# Patient Record
Sex: Female | Born: 1990 | Race: White | Hispanic: No | Marital: Married | Smoking: Never smoker
Health system: Southern US, Community
[De-identification: ages and names within clinical notes are randomized; demographics above are authoritative.]

## PROBLEM LIST (undated history)

## (undated) DIAGNOSIS — K5090 Crohn's disease, unspecified, without complications: Secondary | ICD-10-CM

## (undated) DIAGNOSIS — K5190 Ulcerative colitis, unspecified, without complications: Secondary | ICD-10-CM

## (undated) DIAGNOSIS — G43909 Migraine, unspecified, not intractable, without status migrainosus: Secondary | ICD-10-CM

## (undated) DIAGNOSIS — T7840XA Allergy, unspecified, initial encounter: Secondary | ICD-10-CM

## (undated) DIAGNOSIS — K91850 Pouchitis: Secondary | ICD-10-CM

## (undated) HISTORY — PX: COLON SURGERY: SHX602

## (undated) HISTORY — DX: Pouchitis: K91.850

## (undated) HISTORY — DX: Allergy, unspecified, initial encounter: T78.40XA

## (undated) HISTORY — DX: Crohn's disease, unspecified, without complications: K50.90

---

## 2010-07-29 HISTORY — PX: TOTAL COLECTOMY: SHX852

## 2010-07-29 HISTORY — PX: COLOPROCTECTOMY W/ ILEO J POUCH: SUR277

## 2010-07-29 HISTORY — PX: ILEAL POUCH: SHX5856

## 2017-01-28 DIAGNOSIS — Z719 Counseling, unspecified: Secondary | ICD-10-CM | POA: Insufficient documentation

## 2017-01-28 DIAGNOSIS — M810 Age-related osteoporosis without current pathological fracture: Secondary | ICD-10-CM | POA: Insufficient documentation

## 2017-01-28 DIAGNOSIS — K51919 Ulcerative colitis, unspecified with unspecified complications: Secondary | ICD-10-CM | POA: Insufficient documentation

## 2017-01-28 DIAGNOSIS — Z0000 Encounter for general adult medical examination without abnormal findings: Secondary | ICD-10-CM | POA: Insufficient documentation

## 2017-09-12 DIAGNOSIS — G43909 Migraine, unspecified, not intractable, without status migrainosus: Secondary | ICD-10-CM | POA: Insufficient documentation

## 2018-06-19 DIAGNOSIS — E876 Hypokalemia: Secondary | ICD-10-CM | POA: Insufficient documentation

## 2018-12-29 DIAGNOSIS — Z98891 History of uterine scar from previous surgery: Secondary | ICD-10-CM | POA: Insufficient documentation

## 2021-01-03 DIAGNOSIS — Z3480 Encounter for supervision of other normal pregnancy, unspecified trimester: Secondary | ICD-10-CM | POA: Insufficient documentation

## 2021-01-16 DIAGNOSIS — O1211 Gestational proteinuria, first trimester: Secondary | ICD-10-CM | POA: Insufficient documentation

## 2021-03-22 DIAGNOSIS — O99019 Anemia complicating pregnancy, unspecified trimester: Secondary | ICD-10-CM | POA: Insufficient documentation

## 2021-04-17 ENCOUNTER — Other Ambulatory Visit: Payer: 59

## 2021-04-17 ENCOUNTER — Other Ambulatory Visit: Payer: Self-pay

## 2021-04-17 ENCOUNTER — Ambulatory Visit (INDEPENDENT_AMBULATORY_CARE_PROVIDER_SITE_OTHER): Payer: 59 | Admitting: Student

## 2021-04-17 ENCOUNTER — Encounter: Payer: Self-pay | Admitting: Student

## 2021-04-17 VITALS — BP 127/77 | HR 92 | Ht 67.0 in | Wt 149.0 lb

## 2021-04-17 DIAGNOSIS — Z23 Encounter for immunization: Secondary | ICD-10-CM | POA: Diagnosis not present

## 2021-04-17 DIAGNOSIS — Z3490 Encounter for supervision of normal pregnancy, unspecified, unspecified trimester: Secondary | ICD-10-CM

## 2021-04-17 DIAGNOSIS — O0990 Supervision of high risk pregnancy, unspecified, unspecified trimester: Secondary | ICD-10-CM

## 2021-04-17 DIAGNOSIS — Z3A26 26 weeks gestation of pregnancy: Secondary | ICD-10-CM | POA: Diagnosis not present

## 2021-04-17 DIAGNOSIS — K5180 Other ulcerative colitis without complications: Secondary | ICD-10-CM | POA: Diagnosis not present

## 2021-04-17 DIAGNOSIS — K5190 Ulcerative colitis, unspecified, without complications: Secondary | ICD-10-CM | POA: Insufficient documentation

## 2021-04-17 DIAGNOSIS — Z98891 History of uterine scar from previous surgery: Secondary | ICD-10-CM | POA: Insufficient documentation

## 2021-04-17 NOTE — Progress Notes (Signed)
  Subjective:    Brooke Mckee is being seen today for her first obstetrical visit.  This is a planned pregnancy. She is at [redacted]w[redacted]d gestation. Her obstetrical history is significant for  one c/section in December 29, 2018 . She has a history of colon resection with ileal analanastomosis pouch.  Relationship with FOB: spouse, living together. Patient does intend to breast feed. Pregnancy history fully reviewed.  Patient reports no complaints.  Review of Systems:   Review of Systems  Constitutional: Negative.   HENT: Negative.    Respiratory: Negative.    Cardiovascular: Negative.   Gastrointestinal: Negative.   Genitourinary: Negative.   Neurological: Negative.   Hematological: Negative.    Objective:     BP 127/77   Pulse 92   Ht 5\' 7"  (1.702 m)   Wt 149 lb (67.6 kg)   BMI 23.34 kg/m  Physical Exam Constitutional:      Appearance: Normal appearance.  Cardiovascular:     Pulses: Normal pulses.  Pulmonary:     Effort: Pulmonary effort is normal.  Musculoskeletal:     Cervical back: Normal range of motion.  Skin:    General: Skin is warm.  Neurological:     Mental Status: She is alert.    Exam    Assessment:    Pregnancy: G2P1000 Patient Active Problem List   Diagnosis Date Noted   Supervision of high risk pregnancy, antepartum 54/49/2010   Anemia complicating pregnancy, unspecified trimester 03/22/2021       Plan:     Initial labs drawn. Prenatal vitamins. Problem list reviewed and updated. AFP3 discussed:  normal, plus normal NIPS . Role of ultrasound in pregnancy discussed; fetal survey: ordered. Amniocentesis discussed: not indicated. Follow up in 4 weeks. 75 % of 30 min visit spent on counseling and coordination of care.  -pap normal at [facility] per patient -request filled out to get OP note from patient's practice in [place]; op note is not visible in care everywhere -GTT today Merv[facility] Ske[facility] 04/17/2021

## 2021-04-18 LAB — GLUCOSE TOLERANCE, 2 HOURS W/ 1HR
Glucose, 1 hour: 78 mg/dL (ref 65–179)
Glucose, 2 hour: 86 mg/dL (ref 65–152)
Glucose, Fasting: 72 mg/dL (ref 65–91)

## 2021-05-03 ENCOUNTER — Other Ambulatory Visit: Payer: Self-pay

## 2021-05-03 ENCOUNTER — Ambulatory Visit (INDEPENDENT_AMBULATORY_CARE_PROVIDER_SITE_OTHER): Payer: 59 | Admitting: Obstetrics & Gynecology

## 2021-05-03 VITALS — BP 125/66 | HR 89 | Wt 155.1 lb

## 2021-05-03 DIAGNOSIS — Z23 Encounter for immunization: Secondary | ICD-10-CM | POA: Diagnosis not present

## 2021-05-03 DIAGNOSIS — O0990 Supervision of high risk pregnancy, unspecified, unspecified trimester: Secondary | ICD-10-CM | POA: Diagnosis not present

## 2021-05-03 DIAGNOSIS — K5180 Other ulcerative colitis without complications: Secondary | ICD-10-CM

## 2021-05-03 DIAGNOSIS — Z98891 History of uterine scar from previous surgery: Secondary | ICD-10-CM

## 2021-05-03 DIAGNOSIS — Z3A28 28 weeks gestation of pregnancy: Secondary | ICD-10-CM

## 2021-05-03 NOTE — Progress Notes (Signed)
   PRENATAL VISIT NOTE  Subjective:  Brooke Mckee is a 30 y.o. G2P1001 at [redacted]w[redacted]d being seen today for ongoing prenatal care.  She is currently monitored for the following issues for this high-risk pregnancy and has Anemia complicating pregnancy, unspecified trimester; Supervision of high risk pregnancy, antepartum; History of C-section; Ulcerative colitis ([place]); and S/P primary low transverse C-section on their problem list.  Patient reports  occasional abdominal cramps with stress or activity .  Contractions: Not present. Vag. Bleeding: None.  Movement: Present. Denies leaking of fluid.   The following portions of the patient's history were reviewed and updated as appropriate: allergies, current medications, past family history, past medical history, past social history, past surgical history and problem list.   Objective:   Vitals:   05/03/21 1420  BP: 125/66  Pulse: 89  Weight: 155 lb 1.6 oz (70.4 kg)  FH 30  Fetal Status:     Movement: Present     General:  Alert, oriented and cooperative. Patient is in no acute distress.  Skin: Skin is warm and dry. No rash noted.   Cardiovascular: Normal heart rate noted  Respiratory: Normal respiratory effort, no problems with respiration noted  Abdomen: Soft, gravid, appropriate for gestational age.  Pain/Pressure: Present     Pelvic: Cervical exam deferred        Extremities: Normal range of motion.  Edema: None  Mental Status: Normal mood and affect. Normal behavior. Normal judgment and thought content.   Assessment and Plan:  Pregnancy: G2P1001 at [redacted]w[redacted]d 1. Supervision of high risk pregnancy, antepartum routine - RPR - HIV Antibody (routine testing w rflx) - CBC  2. History of C-section Elective repeat at 39 weeks  3. Other ulcerative colitis without complication (HCC) S/p colectomy and J-pouch for UC  Preterm labor symptoms and general obstetric precautions including but not limited to vaginal bleeding, contractions, leaking of  fluid and fetal movement were reviewed in detail with the patient. Please refer to After Visit Summary for other counseling recommendations.   Return in about 2 weeks (around 05/17/2021).  No future appointments.  Emeterio Reeve, MD

## 2021-05-04 LAB — CBC
Hematocrit: 30.1 % — ABNORMAL LOW (ref 34.0–46.6)
Hemoglobin: 9.8 g/dL — ABNORMAL LOW (ref 11.1–15.9)
MCH: 27.5 pg (ref 26.6–33.0)
MCHC: 32.6 g/dL (ref 31.5–35.7)
MCV: 85 fL (ref 79–97)
Platelets: 342 10*3/uL (ref 150–450)
RBC: 3.56 x10E6/uL — ABNORMAL LOW (ref 3.77–5.28)
RDW: 16.9 % — ABNORMAL HIGH (ref 11.7–15.4)
WBC: 9.5 10*3/uL (ref 3.4–10.8)

## 2021-05-04 LAB — HIV ANTIBODY (ROUTINE TESTING W REFLEX): HIV Screen 4th Generation wRfx: NONREACTIVE

## 2021-05-04 LAB — RPR: RPR Ser Ql: NONREACTIVE

## 2021-05-21 ENCOUNTER — Ambulatory Visit (INDEPENDENT_AMBULATORY_CARE_PROVIDER_SITE_OTHER): Payer: 59 | Admitting: Family Medicine

## 2021-05-21 VITALS — Wt 152.4 lb

## 2021-05-21 DIAGNOSIS — O34219 Maternal care for unspecified type scar from previous cesarean delivery: Secondary | ICD-10-CM

## 2021-05-21 DIAGNOSIS — K5180 Other ulcerative colitis without complications: Secondary | ICD-10-CM

## 2021-05-21 DIAGNOSIS — O99013 Anemia complicating pregnancy, third trimester: Secondary | ICD-10-CM

## 2021-05-21 DIAGNOSIS — Z98891 History of uterine scar from previous surgery: Secondary | ICD-10-CM

## 2021-05-21 DIAGNOSIS — Z3A31 31 weeks gestation of pregnancy: Secondary | ICD-10-CM

## 2021-05-21 DIAGNOSIS — D649 Anemia, unspecified: Secondary | ICD-10-CM

## 2021-05-21 DIAGNOSIS — O99613 Diseases of the digestive system complicating pregnancy, third trimester: Secondary | ICD-10-CM

## 2021-05-21 DIAGNOSIS — O0990 Supervision of high risk pregnancy, unspecified, unspecified trimester: Secondary | ICD-10-CM

## 2021-05-21 NOTE — Addendum Note (Signed)
Addended byRenard Matter on: 05/21/2021 05:57 PM   Modules accepted: Orders

## 2021-05-21 NOTE — Progress Notes (Addendum)
    Subjective:  Taylormarie Register is a 30 y.o. G2P1001 at [redacted]w[redacted]d seen via [place] visit today for ongoing prenatal care.  Patient informed that due to limitations of MyChart virtual visit if there are any concerns that require in person evaluation she will be informed. She is currently monitored for the following issues for this high-risk pregnancy and has Anemia complicating pregnancy, unspecified trimester; Supervision of high risk pregnancy, antepartum; History of C-section; Ulcerative colitis ([place]); and S/P primary low transverse C-section on their problem list.  Patient reports  Had three contractions a couple of days ago, has some low back pain .  Contractions: Irritability. Vag. Bleeding: None.  Movement: Present. Denies leaking of fluid.   The following portions of the patient's history were reviewed and updated as appropriate: allergies, current medications, past family history, past medical history, past social history, past surgical history and problem list. Problem list updated.  Objective:   Vitals:   05/21/21 1459  Weight: 152 lb 6.4 oz (69.1 kg)    Fetal Status:     Movement: Present     Exam deferred given MyChart virtual visit  General:  Alert, oriented and cooperative. Patient is in no acute distress.  Mental Status: Normal mood and affect. Normal behavior. Normal judgment and thought content.    Assessment and Plan:  Pregnancy: G2P1001 at [redacted]w[redacted]d  1. Supervision of high risk pregnancy, antepartum 2. [redacted] weeks gestation of pregnancy Overall doing well. Has some back pain and plans to use belly band. Discussed results of 28 week labs and patient expresses understanding. - follow up in 2 weeks - Last [place] was anatomy US at 22wks. Given high risk pregnancy in setting of colectomy will get [place] now  3. History of C-section Patient desires repeat CS and informed by colorectal surgeon that should have CS as mode of delivery - Needs repeat CS scheduled at 39 weeks which would be  12/18 (not scheduled during this visit but patient informed should be scheduled during next visit or visit after)  4. Other ulcerative colitis without complication (HCC) Currently no flare symptoms.   5. Anemia of pregnancy HgB 9.8, reports taking PO iron Counseled on symptoms to alert about. If has worsening or sx in general could be candidate for IV Iron.  Preterm labor symptoms and general obstetric precautions including but not limited to vaginal bleeding, contractions, leaking of fluid and fetal movement were reviewed in detail with the patient. Please refer to After Visit Summary for other counseling recommendations.  Return in about 2 weeks (around 06/04/2021) for HROB, MD/DO only.  Renard Matter, MD, MPH OB Fellow, Faculty Practice

## 2021-05-23 ENCOUNTER — Encounter (HOSPITAL_COMMUNITY): Payer: Self-pay

## 2021-05-23 ENCOUNTER — Ambulatory Visit (HOSPITAL_COMMUNITY)
Admission: EM | Admit: 2021-05-23 | Discharge: 2021-05-23 | Disposition: A | Discharge status: Home / Self Care | Payer: 59 | Attending: Emergency Medicine | Admitting: Emergency Medicine

## 2021-05-23 ENCOUNTER — Other Ambulatory Visit: Payer: Self-pay

## 2021-05-23 DIAGNOSIS — O26893 Other specified pregnancy related conditions, third trimester: Secondary | ICD-10-CM | POA: Diagnosis not present

## 2021-05-23 DIAGNOSIS — O99513 Diseases of the respiratory system complicating pregnancy, third trimester: Secondary | ICD-10-CM | POA: Diagnosis not present

## 2021-05-23 DIAGNOSIS — Z3A31 31 weeks gestation of pregnancy: Secondary | ICD-10-CM | POA: Insufficient documentation

## 2021-05-23 DIAGNOSIS — J209 Acute bronchitis, unspecified: Secondary | ICD-10-CM | POA: Insufficient documentation

## 2021-05-23 DIAGNOSIS — R051 Acute cough: Secondary | ICD-10-CM | POA: Diagnosis not present

## 2021-05-23 DIAGNOSIS — Z20822 Contact with and (suspected) exposure to covid-19: Secondary | ICD-10-CM | POA: Insufficient documentation

## 2021-05-23 LAB — POC INFLUENZA A AND B ANTIGEN (URGENT CARE ONLY)
INFLUENZA A ANTIGEN, POC: NEGATIVE
INFLUENZA B ANTIGEN, POC: NEGATIVE

## 2021-05-23 LAB — SARS CORONAVIRUS 2 (TAT 6-24 HRS): SARS Coronavirus 2: NEGATIVE

## 2021-05-23 MED ORDER — ALBUTEROL SULFATE HFA 108 (90 BASE) MCG/ACT IN AERS: 1.0000 | INHALATION_SPRAY | Freq: Four times a day (QID) | RESPIRATORY_TRACT | 0 refills | Status: DC | PRN

## 2021-05-23 NOTE — ED Triage Notes (Signed)
Pt c/o cough x1wk, worse last night. States her o2 was 88% at home. C/o lt ear pain and diarrhea x1wk. States [redacted] wks pregnant with no problems. No distress noted

## 2021-05-23 NOTE — Discharge Instructions (Addendum)
Follow up with your obgyn  If symptoms become worse [facility]  You can take mucinex as need or robitussin  as needed  Used inahler as needed for sob and cough

## 2021-05-23 NOTE — ED Provider Notes (Signed)
[facility]    CSN: [id number] Arrival date & time: 05/23/21  6160      History   Chief Complaint Chief Complaint  Patient presents with   Cough    HPI Brooke Mckee is a 30 y.o. female.   Pt here for cough and low ox at home. States that she has not been feeling well for 1 week. Pt is 31 weeks preg and is limited on medications she can take. Has taken tylenol with no change. This am was coughing and checked her ox on pulse ox and it was 88%.    History reviewed. No pertinent past medical history.  Patient Active Problem List   Diagnosis Date Noted   Supervision of high risk pregnancy, antepartum 04/17/2021   History of C-section 04/17/2021   Ulcerative colitis ([place]) 73/71/0626   Anemia complicating pregnancy, unspecified trimester 03/22/2021   S/P primary low transverse C-section 12/29/2018    History reviewed. No pertinent surgical history.  OB History     Gravida  2   Para  1   Term  1   Preterm  0   AB  0   Living  1      SAB  0   IAB  0   Ectopic  0   Multiple  0   Live Births  0            Home Medications    Prior to Admission medications   Medication Sig Start Date End Date Taking? Authorizing Provider  albuterol (VENTOLIN HFA) 108 (90 Base) MCG/ACT inhaler Inhale 1-2 puffs into the lungs every 6 (six) hours as needed for wheezing or shortness of breath. 05/23/21  Yes Marney Setting, NP  ferrous sulfate 325 (65 FE) MG tablet Take 325 mg by mouth daily with breakfast.    [provider]  Prenatal Vit-Fe Fumarate-FA (PRENATAL VITAMINS PO) Prenatal Vitamin    [provider]    Family History History reviewed. No pertinent family history.  Social History Social History   Tobacco Use   Smoking status: Never   Smokeless tobacco: Never     Allergies   Morphine   Review of Systems Review of Systems  Constitutional:  Negative for activity change, chills and fever.  HENT:  Positive for ear  pain, postnasal drip and sneezing. Negative for ear discharge.   Respiratory:  Positive for cough. Negative for wheezing.   Cardiovascular: Negative.   Gastrointestinal:        Denies 31 weeks preg   Genitourinary: Negative.   Musculoskeletal: Negative.   Skin: Negative.   Neurological: Negative.     Physical Exam Triage Vital Signs ED Triage Vitals  Enc Vitals Group     BP 05/23/21 0852 117/73     Pulse Rate 05/23/21 0852 96     Resp 05/23/21 0852 18     Temp 05/23/21 0852 98.4 F (36.9 C)     Temp src --      SpO2 05/23/21 0852 100 %     Weight --      Height --      Head Circumference --      Peak Flow --      Pain Score 05/23/21 0853 0     Pain Loc --      Pain Edu? --      Excl. in [place]? --    No data found.  Updated Vital Signs BP 117/73   Pulse 96   Temp  98.4 F (36.9 C)   Resp 18   LMP  (LMP Unknown)   SpO2 100%   Visual Acuity Right Eye Distance:   Left Eye Distance:   Bilateral Distance:    Right Eye Near:   Left Eye Near:    Bilateral Near:     Physical Exam Constitutional:      General: She is not in acute distress.    Appearance: Normal appearance. She is not ill-appearing.  HENT:     Right Ear: Tympanic membrane normal.     Left Ear: Tympanic membrane normal.     Nose: Congestion present.     Mouth/Throat:     Mouth: Mucous membranes are moist.  Eyes:     Pupils: Pupils are equal, round, and reactive to light.  Cardiovascular:     Rate and Rhythm: Normal rate.  Pulmonary:     Effort: Pulmonary effort is normal.  Abdominal:     General: Abdomen is flat.  Skin:    General: Skin is warm.  Neurological:     General: No focal deficit present.     Mental Status: She is alert.     UC Treatments / Results  Labs (all labs ordered are listed, but only abnormal results are displayed) Labs Reviewed  SARS CORONAVIRUS 2 (TAT 6-24 HRS)  POC INFLUENZA A AND B ANTIGEN (URGENT CARE ONLY)    EKG   Radiology No results  found.  Procedures Procedures (including critical care time)  Medications Ordered in UC Medications - No data to display  Initial Impression / Assessment and Plan / UC Course  I have reviewed the triage vital signs and the nursing notes.  Pertinent labs & imaging results that were available during my care of the patient were reviewed by me and considered in my medical decision making (see chart for details).     Ox 100% in no distress  Pt wanting covid and flu test will send off and call with any positive results  Limited on medications to take  Use inhaler as needed can take otc zyrtec and mucinex  [facility] if symptoms continue or you have shortness of breath  Final Clinical Impressions(s) / UC Diagnoses   Final diagnoses:  Acute cough  Acute bronchitis, unspecified organism     Discharge Instructions      Follow up with your obgyn  If symptoms become worse [facility] to [facility]  You can take mucinex as need or robitussin  as needed  Used inahler as needed for sob and cough       ED Prescriptions     Medication Sig Dispense Auth. Provider   albuterol (VENTOLIN HFA) 108 (90 Base) MCG/ACT inhaler Inhale 1-2 puffs into the lungs every 6 (six) hours as needed for wheezing or shortness of breath. 90 g Marney Setting, NP      PDMP not reviewed this encounter.   Marney Setting, NP 05/23/21 1024

## 2021-06-04 ENCOUNTER — Emergency Department (HOSPITAL_COMMUNITY): Payer: 59

## 2021-06-04 ENCOUNTER — Encounter: Payer: Self-pay | Admitting: Surgery

## 2021-06-04 ENCOUNTER — Encounter: Payer: 59 | Admitting: Obstetrics & Gynecology

## 2021-06-04 ENCOUNTER — Encounter (HOSPITAL_COMMUNITY): Payer: Self-pay | Admitting: Obstetrics and Gynecology

## 2021-06-04 ENCOUNTER — Inpatient Hospital Stay (HOSPITAL_COMMUNITY)
Admission: EM | Admit: 2021-06-04 | Discharge: 2021-06-06 | DRG: 832 | Disposition: A | Discharge status: Home / Self Care | Payer: 59 | Attending: Obstetrics and Gynecology | Admitting: Obstetrics and Gynecology

## 2021-06-04 DIAGNOSIS — K5100 Ulcerative (chronic) pancolitis without complications: Secondary | ICD-10-CM | POA: Diagnosis not present

## 2021-06-04 DIAGNOSIS — K56600 Partial intestinal obstruction, unspecified as to cause: Secondary | ICD-10-CM | POA: Diagnosis not present

## 2021-06-04 DIAGNOSIS — Z3A33 33 weeks gestation of pregnancy: Secondary | ICD-10-CM | POA: Diagnosis not present

## 2021-06-04 DIAGNOSIS — Z20822 Contact with and (suspected) exposure to covid-19: Secondary | ICD-10-CM | POA: Diagnosis not present

## 2021-06-04 DIAGNOSIS — K5180 Other ulcerative colitis without complications: Secondary | ICD-10-CM

## 2021-06-04 DIAGNOSIS — O99283 Endocrine, nutritional and metabolic diseases complicating pregnancy, third trimester: Secondary | ICD-10-CM | POA: Diagnosis present

## 2021-06-04 DIAGNOSIS — K91850 Pouchitis: Secondary | ICD-10-CM | POA: Diagnosis not present

## 2021-06-04 DIAGNOSIS — N6019 Diffuse cystic mastopathy of unspecified breast: Secondary | ICD-10-CM | POA: Insufficient documentation

## 2021-06-04 DIAGNOSIS — O99613 Diseases of the digestive system complicating pregnancy, third trimester: Principal | ICD-10-CM | POA: Diagnosis present

## 2021-06-04 DIAGNOSIS — O0993 Supervision of high risk pregnancy, unspecified, third trimester: Secondary | ICD-10-CM

## 2021-06-04 DIAGNOSIS — R112 Nausea with vomiting, unspecified: Secondary | ICD-10-CM

## 2021-06-04 DIAGNOSIS — R109 Unspecified abdominal pain: Secondary | ICD-10-CM | POA: Diagnosis not present

## 2021-06-04 DIAGNOSIS — O218 Other vomiting complicating pregnancy: Secondary | ICD-10-CM | POA: Diagnosis not present

## 2021-06-04 DIAGNOSIS — E871 Hypo-osmolality and hyponatremia: Secondary | ICD-10-CM | POA: Diagnosis present

## 2021-06-04 DIAGNOSIS — O0990 Supervision of high risk pregnancy, unspecified, unspecified trimester: Secondary | ICD-10-CM | POA: Diagnosis not present

## 2021-06-04 DIAGNOSIS — R1032 Left lower quadrant pain: Secondary | ICD-10-CM | POA: Diagnosis not present

## 2021-06-04 DIAGNOSIS — K56609 Unspecified intestinal obstruction, unspecified as to partial versus complete obstruction: Secondary | ICD-10-CM | POA: Diagnosis not present

## 2021-06-04 DIAGNOSIS — K5190 Ulcerative colitis, unspecified, without complications: Secondary | ICD-10-CM | POA: Diagnosis not present

## 2021-06-04 DIAGNOSIS — O212 Late vomiting of pregnancy: Secondary | ICD-10-CM | POA: Diagnosis present

## 2021-06-04 DIAGNOSIS — R7982 Elevated C-reactive protein (CRP): Secondary | ICD-10-CM | POA: Diagnosis present

## 2021-06-04 DIAGNOSIS — O34219 Maternal care for unspecified type scar from previous cesarean delivery: Secondary | ICD-10-CM | POA: Diagnosis not present

## 2021-06-04 DIAGNOSIS — Z9049 Acquired absence of other specified parts of digestive tract: Secondary | ICD-10-CM | POA: Diagnosis not present

## 2021-06-04 DIAGNOSIS — R197 Diarrhea, unspecified: Secondary | ICD-10-CM

## 2021-06-04 DIAGNOSIS — K6389 Other specified diseases of intestine: Secondary | ICD-10-CM | POA: Diagnosis not present

## 2021-06-04 DIAGNOSIS — K529 Noninfective gastroenteritis and colitis, unspecified: Secondary | ICD-10-CM | POA: Diagnosis not present

## 2021-06-04 DIAGNOSIS — O99891 Other specified diseases and conditions complicating pregnancy: Secondary | ICD-10-CM | POA: Diagnosis not present

## 2021-06-04 DIAGNOSIS — O219 Vomiting of pregnancy, unspecified: Secondary | ICD-10-CM | POA: Diagnosis not present

## 2021-06-04 HISTORY — DX: Migraine, unspecified, not intractable, without status migrainosus: G43.909

## 2021-06-04 HISTORY — DX: Ulcerative colitis, unspecified, without complications: K51.90

## 2021-06-04 LAB — URINALYSIS, ROUTINE W REFLEX MICROSCOPIC
Bilirubin Urine: NEGATIVE
Glucose, UA: NEGATIVE mg/dL
Hgb urine dipstick: NEGATIVE
Ketones, ur: 80 mg/dL — AB
Leukocytes,Ua: NEGATIVE
Nitrite: NEGATIVE
Protein, ur: 30 mg/dL — AB
Specific Gravity, Urine: >1.046 — ABNORMAL HIGH (ref 1.005–1.030)
pH: 5 (ref 5.0–8.0)

## 2021-06-04 LAB — CBC WITH DIFFERENTIAL/PLATELET
Abs Immature Granulocytes: 0.05 10*3/uL (ref 0.00–0.07)
Basophils Absolute: 0 10*3/uL (ref 0.0–0.1)
Basophils Relative: 0 %
Eosinophils Absolute: 0 10*3/uL (ref 0.0–0.5)
Eosinophils Relative: 0 %
HCT: 39.7 % (ref 36.0–46.0)
Hemoglobin: 12.8 g/dL (ref 12.0–15.0)
Immature Granulocytes: 1 %
Lymphocytes Relative: 8 %
Lymphs Abs: 0.8 10*3/uL (ref 0.7–4.0)
MCH: 27.5 pg (ref 26.0–34.0)
MCHC: 32.2 g/dL (ref 30.0–36.0)
MCV: 85.4 fL (ref 80.0–100.0)
Monocytes Absolute: 0.7 10*3/uL (ref 0.1–1.0)
Monocytes Relative: 7 %
Neutro Abs: 8.4 10*3/uL — ABNORMAL HIGH (ref 1.7–7.7)
Neutrophils Relative %: 84 %
Platelets: 334 10*3/uL (ref 150–400)
RBC: 4.65 MIL/uL (ref 3.87–5.11)
RDW: 17 % — ABNORMAL HIGH (ref 11.5–15.5)
WBC: 10.1 10*3/uL (ref 4.0–10.5)
nRBC: 0 % (ref 0.0–0.2)

## 2021-06-04 LAB — COMPREHENSIVE METABOLIC PANEL
ALT: 12 U/L (ref 0–44)
AST: 27 U/L (ref 15–41)
Albumin: 3.5 g/dL (ref 3.5–5.0)
Alkaline Phosphatase: 90 U/L (ref 38–126)
Anion gap: 13 (ref 5–15)
BUN: 9 mg/dL (ref 6–20)
CO2: 18 mmol/L — ABNORMAL LOW (ref 22–32)
Calcium: 9.4 mg/dL (ref 8.9–10.3)
Chloride: 100 mmol/L (ref 98–111)
Creatinine, Ser: 0.71 mg/dL (ref 0.44–1.00)
GFR, Estimated: >60 mL/min (ref >60)
Glucose, Bld: 90 mg/dL (ref 70–99)
Potassium: 4.3 mmol/L (ref 3.5–5.1)
Sodium: 131 mmol/L — ABNORMAL LOW (ref 135–145)
Total Bilirubin: 0.9 mg/dL (ref 0.3–1.2)
Total Protein: 7.7 g/dL (ref 6.5–8.1)

## 2021-06-04 LAB — TYPE AND SCREEN
ABO/RH(D): O POS
Antibody Screen: NEGATIVE

## 2021-06-04 LAB — RESP PANEL BY RT-PCR (FLU A&B, COVID) ARPGX2
Influenza A by PCR: NEGATIVE
Influenza B by PCR: NEGATIVE
SARS Coronavirus 2 by RT PCR: NEGATIVE

## 2021-06-04 LAB — LIPASE, BLOOD: Lipase: 39 U/L (ref 11–51)

## 2021-06-04 MED ORDER — ACETAMINOPHEN 325 MG PO TABS: 650.0000 mg | ORAL_TABLET | ORAL | Status: DC | PRN

## 2021-06-04 MED ORDER — IOHEXOL 300 MG/ML  SOLN
75.0000 mL | Freq: Once | INTRAMUSCULAR | Status: AC | PRN
  Administered 2021-06-04: 75 mL via INTRAVENOUS

## 2021-06-04 MED ORDER — LACTATED RINGERS IV SOLN: INTRAVENOUS | Status: DC

## 2021-06-04 MED ORDER — ACETAMINOPHEN 500 MG PO TABS
1000.0000 mg | ORAL_TABLET | Freq: Once | ORAL | Status: AC
  Administered 2021-06-04: 1000 mg via ORAL
  Filled 2021-06-04: qty 2

## 2021-06-04 MED ORDER — ONDANSETRON HCL 4 MG/2ML IJ SOLN
4.0000 mg | Freq: Four times a day (QID) | INTRAMUSCULAR | Status: DC | PRN
  Administered 2021-06-05 – 2021-06-06 (×2): 4 mg via INTRAVENOUS
  Filled 2021-06-04 (×2): qty 2

## 2021-06-04 MED ORDER — SODIUM CHLORIDE 0.9 % IV BOLUS
1000.0000 mL | Freq: Once | INTRAVENOUS | Status: AC
  Administered 2021-06-04: 1000 mL via INTRAVENOUS

## 2021-06-04 NOTE — ED Notes (Signed)
OB and General Surgery at bedside

## 2021-06-04 NOTE — ED Notes (Signed)
Pt not responding for vital recheck

## 2021-06-04 NOTE — ED Provider Notes (Signed)
[facility] EMERGENCY DEPARTMENT Provider Note   CSN: [id number] Arrival date & time: 06/04/21  4628     History No chief complaint on file.   Brooke Mckee is a 30 y.o. female.  Patient with history of ulcerative colitis with total colectomy with pouch and ileoanal anastomosis presents today with complaint of abdominal pain, diarrhea, and vomiting.  Additionally, patient is also currently [redacted] weeks pregnant, and has been followed with faculty OB/GYN practice.  She states that her symptoms have been ongoing for 3 weeks and she is now only passing liquid with persistent vomiting, concerned she might have a blockage.  She states that her pain is primarily on the left side of her abdomen.  No fetal concerns, still feeling movement without vaginal bleeding or discharge. Does states that her previous pregnancy was delivered via scheduled C section. 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  She states that they used to give her an NG tube which was primarily unsuccessful in relieving her symptoms for several weeks, however last time she received a rectal tube which was much more beneficial and relieved her symptoms and in significantly less time.   The history is provided by the patient. No language interpreter was used.      Past Medical History:  Diagnosis Date   Migraines    Ulcerative colitis [facility])     Patient Active Problem List   Diagnosis Date Noted   Supervision of high risk pregnancy, antepartum 04/17/2021   History of C-section 04/17/2021   Ulcerative colitis ([place]) 63/81/7711   Anemia complicating pregnancy, unspecified trimester 03/22/2021   S/P primary low transverse C-section 12/29/2018    Past Surgical History:  Procedure Laterality Date   CESAREAN SECTION     COLON SURGERY     COLON SURGERY     TOTAL  COLECTOMY  2012   with IPAA     OB History     Gravida  2   Para  1   Term  1   Preterm  0   AB  0   Living  1      SAB  0   IAB  0   Ectopic  0   Multiple  0   Live Births  0           No family history on file.  Social History   Tobacco Use   Smoking status: Never   Smokeless tobacco: Never    Home Medications Prior to Admission medications   Medication Sig Start Date End Date Taking? Authorizing Provider  albuterol (VENTOLIN HFA) 108 (90 Base) MCG/ACT inhaler Inhale 1-2 puffs into the lungs every 6 (six) hours as needed for wheezing or shortness of breath. 05/23/21  Yes Marney Setting, NP  ferrous sulfate 325 (65 FE) MG tablet Take 325 mg by mouth daily with breakfast.   Yes [provider]  ondansetron (ZOFRAN-ODT) 8 MG disintegrating tablet Take 8 mg by mouth every 8 (eight) hours as needed for nausea/vomiting. 06/03/21  Yes [provider]  Prenatal Vit-Fe Fumarate-FA (PRENATAL VITAMINS PO) Take 1 tablet by mouth daily.   Yes [provider]  ciprofloxacin (CIPRO) 500 MG tablet Take 500 mg by mouth 2 (two) times daily. 7 day supply Patient not taking: Reported on 06/04/2021 06/03/21   [provider]    Allergies  Morphine  Review of Systems   Review of Systems  Constitutional:  Negative for chills and fever.  Respiratory:  Negative for cough and shortness of breath.   Cardiovascular:  Negative for chest pain, palpitations and leg swelling.  Gastrointestinal:  Positive for abdominal pain, diarrhea, nausea and vomiting. Negative for blood in stool.  Genitourinary:  Negative for difficulty urinating, flank pain, vaginal bleeding, vaginal discharge and vaginal pain.  Skin:  Negative for rash.  Neurological:  Negative for dizziness, tremors, seizures, syncope, facial asymmetry, speech difficulty, weakness, light-headedness, numbness and headaches.  Psychiatric/Behavioral:  Negative for confusion and decreased  concentration.   All other systems reviewed and are negative.  Physical Exam Updated Vital Signs BP 124/75 (BP Location: Left Arm)   Pulse 88   Temp 98.4 F (36.9 C) (Oral)   Resp (!) 26   LMP  (LMP Unknown)   SpO2 100%   Physical Exam Vitals and nursing note reviewed.  Constitutional:      General: She is not in acute distress.    Appearance: She is well-developed and normal weight. She is not ill-appearing, toxic-appearing or diaphoretic.  HENT:     Head: Normocephalic and atraumatic.  Cardiovascular:     Rate and Rhythm: Normal rate and regular rhythm.     Heart sounds: Normal heart sounds.  Pulmonary:     Effort: Pulmonary effort is normal. No respiratory distress.     Breath sounds: Normal breath sounds. No stridor. No wheezing, rhonchi or rales.  Abdominal:     Tenderness: There is abdominal tenderness in the left upper quadrant and left lower quadrant.     Comments: Gravid abdomen  Skin:    General: Skin is warm and dry.  Neurological:     General: No focal deficit present.     Mental Status: She is alert.  Psychiatric:        Mood and Affect: Mood normal.        Behavior: Behavior normal.    ED Results / Procedures / Treatments   Labs (all labs ordered are listed, but only abnormal results are displayed) Labs Reviewed  COMPREHENSIVE METABOLIC PANEL - Abnormal; Notable for the following components:      Result Value   Sodium 131 (*)    CO2 18 (*)    All other components within normal limits  CBC WITH DIFFERENTIAL/PLATELET - Abnormal; Notable for the following components:   RDW 17.0 (*)    Neutro Abs 8.4 (*)    All other components within normal limits  LIPASE, BLOOD  URINALYSIS, ROUTINE W REFLEX MICROSCOPIC    EKG None  Radiology CT Abdomen Pelvis W Contrast  Result Date: 06/04/2021 CLINICAL DATA:  Bowel obstruction suspected. History of ulcerative colitis. EXAM: CT ABDOMEN AND PELVIS WITH CONTRAST TECHNIQUE: Multidetector CT imaging of the abdomen  and pelvis was performed using the standard protocol following bolus administration of intravenous contrast. CONTRAST:  63mL OMNIPAQUE IOHEXOL 300 MG/ML  SOLN COMPARISON:  None. FINDINGS: Lower chest: Clear lung bases. Normal heart size without pericardial or pleural effusion. Hepatobiliary: Normal liver. The gallbladder is possibly identified on 20/3, surrounded by small bowel loops. No calcified stone or surrounding inflammation. No biliary duct dilatation. Pancreas: Normal, without mass or ductal dilatation. Spleen: Normal in size, without focal abnormality. Adrenals/Urinary Tract: Normal adrenal glands. Normal kidneys, without hydronephrosis. Decompressed urinary bladder. Stomach/Bowel: Normal stomach, without wall thickening. Status post colectomy and ileoanal anastomosis with J-pouch. No inflammation of the J-pouch, which is fluid-filled. Proximal small bowel  is normal in caliber. The distal small bowel just proximal to the anastomosis is dilated and fluid-filled within the right-side of the abdomen, including at 6.4 cm on coronal image 58. Vascular/Lymphatic: Normal caliber of the aorta and branch vessels. No abdominopelvic adenopathy. Reproductive: Intrauterine pregnancy which is cephalic. The placenta is positioned superiorly and anteriorly. No dominant adnexal mass. Other: No abdominal ascites or free intraperitoneal air. No pelvic fluid. Musculoskeletal: Proximal left femur fixation. IMPRESSION: 1. Status post colectomy and ileoanal anastomosis. The small bowel proximal to the anastomosis is moderately dilated, suggesting partial small bowel obstruction. No evidence of inflammation involving the J-pouch or the small bowel. 2. No other explanation for abdominal pain. Electronically Signed   By: Abigail Miyamoto M.D.   On: 06/04/2021 15:36    Procedures Procedures   Medications Ordered in ED Medications  sodium chloride 0.9 % bolus 1,000 mL (has no administration in time range)  iohexol (OMNIPAQUE) 300  MG/ML solution 75 mL (75 mLs Intravenous Contrast Given 06/04/21 1519)    ED Course  I have reviewed the triage vital signs and the nursing notes.  Pertinent labs & imaging results that were available during my care of the patient were reviewed by me and considered in my medical decision making (see chart for details).    MDM Rules/Calculators/A&P                         Patient presents today with 3 weeks of left sided abdominal pain with associated nausea, vomiting, and diarrhea. She is also [redacted] weeks pregnant, no complications associated with pregnancy. Benedetto Goad, PA-C spoke with OB/GYN Dr. Ilda Basset in triage who recommended that she remain here in the ED and to perform CT abdomen and pelvis with contrast. Labs reveal sodium 131, likely due to dehydration related to losses with persistent vomiting and diarrhea, will give fluid bolus.  CT scan revealed potential partial small bowel obstruction, will call OB/GYN and surgery to determine dispo. Rapid response OB also called to set up fetal monitoring while the patient remains in the ED.  Spoke with OB/GYN Dr. Ilda Basset and general surgery Dr. Donne Hazel who agree that patient will be admitted to OB/GYN and followed by general surgery. Discussed with patient who is aware and amenable with this plan.   This is a shared visit with supervising physician Dr. Rogene Houston who has independently evaluated patient & provided guidance in evaluation/management/disposition, in agreement with care    Final Clinical Impression(s) / ED Diagnoses Final diagnoses:  Partial small bowel obstruction ([place])  Left lower quadrant abdominal pain  Nausea vomiting and diarrhea    Rx / DC Orders ED Discharge Orders     None        Nestor Lewandowsky 06/04/21 1837    Fredia Sorrow, MD 06/04/21 1846

## 2021-06-04 NOTE — H&P (Signed)
FACULTY PRACTICE ANTEPARTUM ADMISSION HISTORY AND PHYSICAL NOTE   History of Present Illness: Brooke Mckee is a 30 y.o. G2P1001 at [redacted]w[redacted]d admitted for three weeks of diarrhea and 2 days of nausea/vomiting in the scenario of history of ulcerative colitis with previous colectomy and j pouch. She does note intermittent abdominal pain.  She does note emesis x 2 today.  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   Patient reports the fetal movement as active. Patient reports uterine contraction  activity as occasional, rare ctx. Patient reports  vaginal bleeding as none. Patient describes fluid per vagina as None. Fetal presentation is cephalic.  Patient Active Problem List   Diagnosis Date Noted   Fibrocystic disease of breast 06/04/2021   Partial small bowel obstruction ([place]) 06/04/2021   Supervision of high risk pregnancy, antepartum 04/17/2021   History of C-section 73/41/9379   Anemia complicating pregnancy, unspecified trimester 03/22/2021   Proteinuria affecting pregnancy in first trimester 01/16/2021   Encounter for supervision of normal pregnancy in multigravida 01/03/2021   S/P primary low transverse C-section 12/29/2018   Hypokalemia 06/19/2018   Migraine without status migrainosus, not intractable 09/12/2017   Ulcerative colitis with complication ([place]) 02/40/9735   Annual physical exam 01/28/2017   Osteoporosis without current pathological fracture 01/28/2017    Past Medical History:  Diagnosis Date   Migraines    Ulcerative colitis ([place])     Past Surgical History:  Procedure Laterality Date   CESAREAN SECTION     COLON SURGERY     COLOPROCTECTOMY W/ ILEO J POUCH  2012   for UC   TOTAL COLECTOMY  2012   with IPAA    OB History  Gravida Para Term Preterm AB Living  2 1 1  0 0 1  SAB IAB Ectopic Multiple Live Births  0 0 0 0 0    # Outcome Date GA Lbr Len/2nd Weight Sex Delivery Anes PTL Lv  2 Current           1 Term 12/29/18    M CS-LTranv         Complications: Ulcerative colitis    Social History   Socioeconomic History   Marital status: Married    Spouse name: Not on file   Number of children: Not on file   Years of education: Not on file   Highest education level: Not on file  Occupational History   Not on file  Tobacco Use   Smoking status: Never   Smokeless tobacco: Never  Substance and Sexual Activity   Alcohol use: Not on file   Drug use: Not on file   Sexual activity: Not on file  Other Topics Concern   Not on file  Social History Narrative   Not on file   Social Determinants of Health   Financial Resource Strain: Not on file  Food Insecurity: No Food Insecurity   Worried About Running Out of Food in the Last Year: Never true   [place] in the Last Year: Never true  Transportation Needs: No Transportation Needs   Lack of Transportation (Medical): No   Lack of Transportation (Non-Medical): No  Physical Activity: Not on file  Stress: Not on file  Social Connections: Not on file    No family history on file.  Allergies  Allergen Reactions   Morphine Hives    All over body    Medications Prior to Admission  Medication Sig Dispense Refill Last Dose   albuterol (VENTOLIN HFA)  108 (90 Base) MCG/ACT inhaler Inhale 1-2 puffs into the lungs every 6 (six) hours as needed for wheezing or shortness of breath. 90 g 0 Past Month   ferrous sulfate 325 (65 FE) MG tablet Take 325 mg by mouth daily with breakfast.   06/03/2021   ondansetron (ZOFRAN-ODT) 8 MG disintegrating tablet Take 8 mg by mouth every 8 (eight) hours as needed for nausea/vomiting.   06/04/2021   Prenatal Vit-Fe Fumarate-FA (PRENATAL VITAMINS PO) Take 1 tablet by mouth daily.   06/03/2021   ciprofloxacin (CIPRO) 500 MG tablet Take 500 mg by mouth 2 (two) times daily. 7 day supply (Patient not taking: Reported on 06/04/2021)   Completed Course    Review of Systems - Gastrointestinal ROS: positive for - abdominal pain, diarrhea, and  nausea/vomiting  Vitals:  BP 110/63 (BP Location: Left Arm)   Pulse (!) 123   Temp 98.2 F (36.8 C) (Oral)   Resp 20   LMP  (LMP Unknown)   SpO2 99%  Physical Examination: CONSTITUTIONAL: Well-developed, well-nourished female in no acute distress.  HENT:  Normocephalic, atraumatic, External right and left ear normal. Oropharynx is clear and moist EYES: Conjunctivae and EOM are normal. Pupils are equal, round, and reactive to light. No scleral icterus.  NECK: Normal range of motion, supple, no masses SKIN: Skin is warm and dry. No rash noted. Not diaphoretic. No erythema. No pallor. [place]: Alert and oriented to person, place, and time. Normal reflexes, muscle tone coordination. No cranial nerve deficit noted. PSYCHIATRIC: Normal mood and affect. Normal behavior. Normal judgment and thought content. CARDIOVASCULAR: Normal heart rate noted, regular rhythm RESPIRATORY: Effort and breath sounds normal, no problems with respiration noted ABDOMEN: Soft, nontender, nondistended, gravid. MUSCULOSKELETAL: Normal range of motion. No edema and no tenderness. 2+ distal pulses.  Cervix: deferred  fetal presentation is cephalic. Membranes:intact Fetal Monitoring: category 1 strip while monitored in the ED Tocometer: rare ctx  Labs:  Results for orders placed or performed during the hospital encounter of 06/04/21 (from the past 24 hour(s))  Comprehensive metabolic panel   Collection Time: 06/04/21 10:04 AM  Result Value Ref Range   Sodium 131 (L) 135 - 145 mmol/L   Potassium 4.3 3.5 - 5.1 mmol/L   Chloride 100 98 - 111 mmol/L   CO2 18 (L) 22 - 32 mmol/L   Glucose, Bld 90 70 - 99 mg/dL   BUN 9 6 - 20 mg/dL   Creatinine, Ser 0.71 0.44 - 1.00 mg/dL   Calcium 9.4 8.9 - 10.3 mg/dL   Total Protein 7.7 6.5 - 8.1 g/dL   Albumin 3.5 3.5 - 5.0 g/dL   AST 27 15 - 41 U/L   ALT 12 0 - 44 U/L   Alkaline Phosphatase 90 38 - 126 U/L   Total Bilirubin 0.9 0.3 - 1.2 mg/dL   GFR, Estimated >60 >60  mL/min   Anion gap 13 5 - 15  CBC with Differential   Collection Time: 06/04/21 10:04 AM  Result Value Ref Range   WBC 10.1 4.0 - 10.5 K/uL   RBC 4.65 3.87 - 5.11 MIL/uL   Hemoglobin 12.8 12.0 - 15.0 g/dL   HCT 39.7 36.0 - 46.0 %   MCV 85.4 80.0 - 100.0 fL   MCH 27.5 26.0 - 34.0 pg   MCHC 32.2 30.0 - 36.0 g/dL   RDW 17.0 (H) 11.5 - 15.5 %   Platelets 334 150 - 400 K/uL   nRBC 0.0 0.0 - 0.2 %   Neutrophils  Relative % 84 %   Neutro Abs 8.4 (H) 1.7 - 7.7 K/uL   Lymphocytes Relative 8 %   Lymphs Abs 0.8 0.7 - 4.0 K/uL   Monocytes Relative 7 %   Monocytes Absolute 0.7 0.1 - 1.0 K/uL   Eosinophils Relative 0 %   Eosinophils Absolute 0.0 0.0 - 0.5 K/uL   Basophils Relative 0 %   Basophils Absolute 0.0 0.0 - 0.1 K/uL   Immature Granulocytes 1 %   Abs Immature Granulocytes 0.05 0.00 - 0.07 K/uL  Lipase, blood   Collection Time: 06/04/21 10:04 AM  Result Value Ref Range   Lipase 39 11 - 51 U/L  Urinalysis, Routine w reflex microscopic Urine, Clean Catch   Collection Time: 06/04/21  4:11 PM  Result Value Ref Range   Color, Urine YELLOW YELLOW   APPearance HAZY (A) CLEAR   Specific Gravity, Urine >1.046 (H) 1.005 - 1.030   pH 5.0 5.0 - 8.0   Glucose, UA NEGATIVE NEGATIVE mg/dL   Hgb urine dipstick NEGATIVE NEGATIVE   Bilirubin Urine NEGATIVE NEGATIVE   Ketones, ur 80 (A) NEGATIVE mg/dL   Protein, ur 30 (A) NEGATIVE mg/dL   Nitrite NEGATIVE NEGATIVE   Leukocytes,Ua NEGATIVE NEGATIVE   RBC / HPF 0-5 0 - 5 RBC/hpf   WBC, UA 0-5 0 - 5 WBC/hpf   Bacteria, UA RARE (A) NONE SEEN   Squamous Epithelial / LPF 11-20 0 - 5   Mucus PRESENT   Resp Panel by RT-PCR (Flu A&B, Covid) Nasopharyngeal Swab   Collection Time: 06/04/21  5:04 PM   Specimen: Nasopharyngeal Swab; Nasopharyngeal(NP) swabs in vial transport medium  Result Value Ref Range   SARS Coronavirus 2 by RT PCR NEGATIVE NEGATIVE   Influenza A by PCR NEGATIVE NEGATIVE   Influenza B by PCR NEGATIVE NEGATIVE  Type and screen  [place]   Collection Time: 06/04/21  7:14 PM  Result Value Ref Range   ABO/RH(D) PENDING    Antibody Screen PENDING    Sample Expiration      06/07/2021,2359 Performed at [facility], [address] [place], [place] [address]     Imaging Studies: CT Abdomen Pelvis W Contrast  Result Date: 06/04/2021 CLINICAL DATA:  Bowel obstruction suspected. History of ulcerative colitis. EXAM: CT ABDOMEN AND PELVIS WITH CONTRAST TECHNIQUE: Multidetector CT imaging of the abdomen and pelvis was performed using the standard protocol following bolus administration of intravenous contrast. CONTRAST:  83mL OMNIPAQUE IOHEXOL 300 MG/ML  SOLN COMPARISON:  None. FINDINGS: Lower chest: Clear lung bases. Normal heart size without pericardial or pleural effusion. Hepatobiliary: Normal liver. The gallbladder is possibly identified on 20/3, surrounded by small bowel loops. No calcified stone or surrounding inflammation. No biliary duct dilatation. Pancreas: Normal, without mass or ductal dilatation. Spleen: Normal in size, without focal abnormality. Adrenals/Urinary Tract: Normal adrenal glands. Normal kidneys, without hydronephrosis. Decompressed urinary bladder. Stomach/Bowel: Normal stomach, without wall thickening. Status post colectomy and ileoanal anastomosis with J-pouch. No inflammation of the J-pouch, which is fluid-filled. Proximal small bowel is normal in caliber. The distal small bowel just proximal to the anastomosis is dilated and fluid-filled within the right-side of the abdomen, including at 6.4 cm on coronal image 58. Vascular/Lymphatic: Normal caliber of the aorta and branch vessels. No abdominopelvic adenopathy. Reproductive: Intrauterine pregnancy which is cephalic. The placenta is positioned superiorly and anteriorly. No dominant adnexal mass. Other: No abdominal ascites or free intraperitoneal air. No pelvic fluid. Musculoskeletal: Proximal left femur fixation. IMPRESSION:  1.  Status post colectomy and ileoanal anastomosis. The small bowel proximal to the anastomosis is moderately dilated, suggesting partial small bowel obstruction. No evidence of inflammation involving the J-pouch or the small bowel. 2. No other explanation for abdominal pain. Electronically Signed   By: Abigail Miyamoto M.D.   On: 06/04/2021 15:36     Assessment and Plan: Patient Active Problem List   Diagnosis Date Noted   Fibrocystic disease of breast 06/04/2021   Partial small bowel obstruction ([place]) 06/04/2021   Supervision of high risk pregnancy, antepartum 04/17/2021   History of C-section 56/70/1410   Anemia complicating pregnancy, unspecified trimester 03/22/2021   Proteinuria affecting pregnancy in first trimester 01/16/2021   Encounter for supervision of normal pregnancy in multigravida 01/03/2021   S/P primary low transverse C-section 12/29/2018   Hypokalemia 06/19/2018   Migraine without status migrainosus, not intractable 09/12/2017   Ulcerative colitis with complication ([place]) 30/13/1438   Annual physical exam 01/28/2017   Osteoporosis without current pathological fracture 01/28/2017  Active Problems:  Nausea/vomiting Partial small bowel obstruction Ulcerative colitis  Admit to Antenatal Observation on OB specialty care. NPO currently, bowel and nutrition management per General Surgery team FHT q shift with daily NST   Lynnda Shields, MD Faculty Attending, [facility]

## 2021-06-04 NOTE — ED Notes (Signed)
Pt reports this is her 2nd pregnancy and previous pregnancy was a C-section.

## 2021-06-04 NOTE — Progress Notes (Signed)
Dr. [name] in to see pt.

## 2021-06-04 NOTE — ED Notes (Signed)
OB rapid response RN at bedside.

## 2021-06-04 NOTE — ED Provider Notes (Signed)
Emergency Medicine Provider Triage Evaluation Note  Brooke Mckee , a 30 y.o. female  was evaluated in triage.  Pt complains of abdominal pain, diarrhea and vomiting.  Patient is currently [redacted] weeks pregnant, has been followed with faculty OB/GYN practice.  Underlying history of ulcerative colitis and with her last pregnancy had an issue with pouchitis later in pregnancy.  She has had a total colectomy.  She reports that she has been having worsening diarrhea over the past 3 weeks and now is only passing liquid and is having vomiting and she is concerned she may have a blockage, she is having increasing pain primarily on the left side of her abdomen.  Still feeling normal fetal movement, no vaginal bleeding or leakage of fluids, no urinary symptoms  Review of Systems  Positive: Abdominal pain, diarrhea, vomiting Negative: Fevers, dysuria, vaginal bleeding, leakage of fluids  Physical Exam  BP 119/82 (BP Location: Left Arm)   Pulse (!) 101   Temp 98.1 F (36.7 C) (Oral)   Resp 16   LMP  (LMP Unknown)   SpO2 100%  Gen:   Awake, no distress   Resp:  Normal effort  MSK:   Moves extremities without difficulty  Other:  Gravid abdomen tender over the left side  Medical Decision Making  Medically screening exam initiated at 10:02 AM.  Appropriate orders placed.  Leo Vickrey was informed that the remainder of the evaluation will be completed by another provider, this initial triage assessment does not replace that evaluation, and the importance of remaining in the ED until their evaluation is complete.  Discussed case with Dr. Nehemiah Settle with OB/GYN, we both agree that patient would be better served on the side of the department for medical evaluation for likely pouchitis or bowel obstruction.  Dr. Sheran Lawless reports that it is safe and appropriate for CT in this clinical setting during pregnancy.   Jacqlyn Larsen, PA-C 06/04/21 1004    Daleen Bo, MD 06/04/21 2040

## 2021-06-04 NOTE — Progress Notes (Signed)
Pt is a G2P1 at 46 1/[redacted] weeks gestation here with a partial bowel obstruction. Her first baby was a C/S .She denies any problems with this pregnancy. No vaginal bleeding or leaking of fluid.

## 2021-06-04 NOTE — ED Notes (Signed)
Pt ambulatory to bathroom independently w/ steady gait and in NAD

## 2021-06-04 NOTE — Progress Notes (Signed)
Spoke with Dr. [name]. Says he will come here to see the pt and discuss a plan of care with the ED MD.[id number]- General surgeon here to talk to pt.

## 2021-06-04 NOTE — Consult Note (Addendum)
Reason for Consult:possible sbo Referring Physician: Dr Arn Medal is an 30 y.o. female.  HPI: 16 yof with history UC who underwent lap colectomy with j pouch about 10 years ago.  Husband is Dr Crosby Oyster (orthopedics). She has had an issue every year or two with this.  These have mostly been pouchitis where she had pain and stopped having bms.  She has had history of a flex sig about 5 years ago.  She has been admitted with an ng tube previously and a rectal tube. Currently she is [redacted] weeks pregnant.  She has had diarrhea (7 per day- normal is 2 stools per day) for 3 weeks and has had n/v not really keeping much down. Has some ab pain associated with this. Pregnancy uncomplicated so far (last was via c section).  She came to er today as not getting better.   Wbc normal, ct shows dilated small bowel with questionable psbo, no real inflammation around pouch.  I was asked to see her.   Past Medical History:  Diagnosis Date   Migraines    Ulcerative colitis [facility])     Past Surgical History:  Procedure Laterality Date   CESAREAN SECTION     COLON SURGERY     COLOPROCTECTOMY W/ ILEO J POUCH  2012   for UC   TOTAL COLECTOMY  2012   with IPAA  Left hip  No family history on file.  Social History:  reports that she has never smoked. She has never used smokeless tobacco. No history on file for alcohol use and drug use.  Allergies:  Allergies  Allergen Reactions   Morphine Hives    All over body    Current Outpatient Medications  Medication Instructions   albuterol (VENTOLIN HFA) 108 (90 Base) MCG/ACT inhaler 1-2 puffs, Inhalation, Every 6 hours PRN   ciprofloxacin (CIPRO) 500 mg, 2 times daily   ferrous sulfate 325 mg, Oral, Daily with breakfast   ondansetron (ZOFRAN-ODT) 8 mg, Oral, Every 8 hours PRN   Prenatal Vit-Fe Fumarate-FA (PRENATAL VITAMINS PO) 1 tablet, Oral, Daily     Results for orders placed or performed during the hospital encounter of 06/04/21 (from the  past 48 hour(s))  Comprehensive metabolic panel     Status: Abnormal   Collection Time: 06/04/21 10:04 AM  Result Value Ref Range   Sodium 131 (L) 135 - 145 mmol/L   Potassium 4.3 3.5 - 5.1 mmol/L   Chloride 100 98 - 111 mmol/L   CO2 18 (L) 22 - 32 mmol/L   Glucose, Bld 90 70 - 99 mg/dL    Comment: Glucose reference range applies only to samples taken after fasting for at least 8 hours.   BUN 9 6 - 20 mg/dL   Creatinine, Ser 0.71 0.44 - 1.00 mg/dL   Calcium 9.4 8.9 - 10.3 mg/dL   Total Protein 7.7 6.5 - 8.1 g/dL   Albumin 3.5 3.5 - 5.0 g/dL   AST 27 15 - 41 U/L   ALT 12 0 - 44 U/L   Alkaline Phosphatase 90 38 - 126 U/L   Total Bilirubin 0.9 0.3 - 1.2 mg/dL   GFR, Estimated >60 >60 mL/min    Comment: (NOTE) Calculated using the CKD-EPI Creatinine Equation (2021)    Anion gap 13 5 - 15    Comment: Performed at [facility] [address], [place], [place] [address]  CBC with Differential     Status: Abnormal   Collection Time: 06/04/21  10:04 AM  Result Value Ref Range   WBC 10.1 4.0 - 10.5 K/uL   RBC 4.65 3.87 - 5.11 MIL/uL   Hemoglobin 12.8 12.0 - 15.0 g/dL   HCT 39.7 36.0 - 46.0 %   MCV 85.4 80.0 - 100.0 fL   MCH 27.5 26.0 - 34.0 pg   MCHC 32.2 30.0 - 36.0 g/dL   RDW 17.0 (H) 11.5 - 15.5 %   Platelets 334 150 - 400 K/uL   nRBC 0.0 0.0 - 0.2 %   Neutrophils Relative % 84 %   Neutro Abs 8.4 (H) 1.7 - 7.7 K/uL   Lymphocytes Relative 8 %   Lymphs Abs 0.8 0.7 - 4.0 K/uL   Monocytes Relative 7 %   Monocytes Absolute 0.7 0.1 - 1.0 K/uL   Eosinophils Relative 0 %   Eosinophils Absolute 0.0 0.0 - 0.5 K/uL   Basophils Relative 0 %   Basophils Absolute 0.0 0.0 - 0.1 K/uL   Immature Granulocytes 1 %   Abs Immature Granulocytes 0.05 0.00 - 0.07 K/uL    Comment: Performed at [facility], [address], [place], [place] [address]  Lipase, blood     Status: None   Collection Time: 06/04/21 10:04 AM  Result Value Ref Range   Lipase 39 11 - 51 U/L    Comment:  Performed at [facility] [address] [place], [place] [address]  Urinalysis, Routine w reflex microscopic Urine, Clean Catch     Status: Abnormal   Collection Time: 06/04/21  4:11 PM  Result Value Ref Range   Color, Urine YELLOW YELLOW   APPearance HAZY (A) CLEAR   Specific Gravity, Urine >1.046 (H) 1.005 - 1.030   pH 5.0 5.0 - 8.0   Glucose, UA NEGATIVE NEGATIVE mg/dL   Hgb urine dipstick NEGATIVE NEGATIVE   Bilirubin Urine NEGATIVE NEGATIVE   Ketones, ur 80 (A) NEGATIVE mg/dL   Protein, ur 30 (A) NEGATIVE mg/dL   Nitrite NEGATIVE NEGATIVE   Leukocytes,Ua NEGATIVE NEGATIVE   RBC / HPF 0-5 0 - 5 RBC/hpf   WBC, UA 0-5 0 - 5 WBC/hpf   Bacteria, UA RARE (A) NONE SEEN   Squamous Epithelial / LPF 11-20 0 - 5   Mucus PRESENT     Comment: Performed at [facility], [place] [address], [place], [place] [address]    CT Abdomen Pelvis W Contrast  Result Date: 06/04/2021 CLINICAL DATA:  Bowel obstruction suspected. History of ulcerative colitis. EXAM: CT ABDOMEN AND PELVIS WITH CONTRAST TECHNIQUE: Multidetector CT imaging of the abdomen and pelvis was performed using the standard protocol following bolus administration of intravenous contrast. CONTRAST:  24mL OMNIPAQUE IOHEXOL 300 MG/ML  SOLN COMPARISON:  None. FINDINGS: Lower chest: Clear lung bases. Normal heart size without pericardial or pleural effusion. Hepatobiliary: Normal liver. The gallbladder is possibly identified on 20/3, surrounded by small bowel loops. No calcified stone or surrounding inflammation. No biliary duct dilatation. Pancreas: Normal, without mass or ductal dilatation. Spleen: Normal in size, without focal abnormality. Adrenals/Urinary Tract: Normal adrenal glands. Normal kidneys, without hydronephrosis. Decompressed urinary bladder. Stomach/Bowel: Normal stomach, without wall thickening. Status post colectomy and ileoanal anastomosis with J-pouch. No inflammation of the J-pouch, which is fluid-filled. Proximal  small bowel is normal in caliber. The distal small bowel just proximal to the anastomosis is dilated and fluid-filled within the right-side of the abdomen, including at 6.4 cm on coronal image 58. Vascular/Lymphatic: Normal caliber of the aorta and branch vessels.  No abdominopelvic adenopathy. Reproductive: Intrauterine pregnancy which is cephalic. The placenta is positioned superiorly and anteriorly. No dominant adnexal mass. Other: No abdominal ascites or free intraperitoneal air. No pelvic fluid. Musculoskeletal: Proximal left femur fixation. IMPRESSION: 1. Status post colectomy and ileoanal anastomosis. The small bowel proximal to the anastomosis is moderately dilated, suggesting partial small bowel obstruction. No evidence of inflammation involving the J-pouch or the small bowel. 2. No other explanation for abdominal pain. Electronically Signed   By: Abigail Miyamoto M.D.   On: 06/04/2021 15:36    Review of Systems  Constitutional:  Negative for fever.  Gastrointestinal:  Positive for abdominal pain, diarrhea, nausea and vomiting.  All other systems reviewed and are negative. Blood pressure 128/81, pulse 97, temperature 98.4 F (36.9 C), temperature source Oral, resp. rate (!) 24, SpO2 98 %. Physical Exam Constitutional:      Appearance: She is well-developed.  Cardiovascular:     Rate and Rhythm: Normal rate.  Pulmonary:     Effort: Pulmonary effort is normal.  Abdominal:     Palpations: Abdomen is soft.     Tenderness: There is no abdominal tenderness.  Neurological:     Mental Status: She is alert.  Palpable fetus Rectal exam with no stricture, no mass, no return of fluid   Assessment/Plan: Ulcerative colitis s/p lap colectomy with J pouch Possible psbo -She has had multiple issues with pouch in discussion with she and her husband by phone.  I think issue is distal and this has been case multiple times.  I will reserve ng tube only for symptoms- if she has continued n/v.  I think  options from below if doesn't get better are a flexiseal, consider flagyl (although I dont think clinically this looks like pouchitis), or a flex sig (avoid sedation) to look up in pouch and possibly where this appears to be on ct scan.   -she is not completely obstructed with this and I think this should resolve nonoperatively. -will be admitted to [facility] and we will follow her also -I updated her husband over the phone [phone]) -appreciate OB assistance  Rolm Bookbinder 06/04/2021, 5:52 PM

## 2021-06-05 ENCOUNTER — Encounter (HOSPITAL_COMMUNITY)
Admission: EM | Disposition: A | Discharge status: Home / Self Care | Payer: Self-pay | Source: Home / Self Care | Attending: Obstetrics and Gynecology

## 2021-06-05 ENCOUNTER — Inpatient Hospital Stay (HOSPITAL_BASED_OUTPATIENT_CLINIC_OR_DEPARTMENT_OTHER): Payer: 59

## 2021-06-05 ENCOUNTER — Other Ambulatory Visit: Payer: Self-pay

## 2021-06-05 ENCOUNTER — Encounter: Payer: Self-pay | Admitting: Obstetrics and Gynecology

## 2021-06-05 DIAGNOSIS — Z3A33 33 weeks gestation of pregnancy: Secondary | ICD-10-CM

## 2021-06-05 DIAGNOSIS — O99891 Other specified diseases and conditions complicating pregnancy: Secondary | ICD-10-CM | POA: Diagnosis not present

## 2021-06-05 DIAGNOSIS — R109 Unspecified abdominal pain: Secondary | ICD-10-CM

## 2021-06-05 DIAGNOSIS — R197 Diarrhea, unspecified: Secondary | ICD-10-CM

## 2021-06-05 DIAGNOSIS — K91850 Pouchitis: Secondary | ICD-10-CM

## 2021-06-05 DIAGNOSIS — O34219 Maternal care for unspecified type scar from previous cesarean delivery: Secondary | ICD-10-CM | POA: Diagnosis not present

## 2021-06-05 DIAGNOSIS — K5190 Ulcerative colitis, unspecified, without complications: Secondary | ICD-10-CM

## 2021-06-05 HISTORY — PX: FLEXIBLE SIGMOIDOSCOPY: SHX5431

## 2021-06-05 HISTORY — PX: BIOPSY: SHX5522

## 2021-06-05 LAB — C-REACTIVE PROTEIN: CRP: 9.9 mg/dL — ABNORMAL HIGH (ref <1.0)

## 2021-06-05 SURGERY — SIGMOIDOSCOPY, FLEXIBLE

## 2021-06-05 MED ORDER — METRONIDAZOLE 500 MG/100ML IV SOLN: 500.0000 mg | Freq: Two times a day (BID) | INTRAVENOUS | Status: DC

## 2021-06-05 MED ORDER — FLEET ENEMA 7-19 GM/118ML RE ENEM: 1.0000 | ENEMA | Freq: Once | RECTAL | Status: DC

## 2021-06-05 MED ORDER — ZINC OXIDE 40 % EX OINT: TOPICAL_OINTMENT | Freq: Three times a day (TID) | CUTANEOUS | Status: DC | PRN

## 2021-06-05 MED ORDER — METRONIDAZOLE 500 MG/100ML IV SOLN
500.0000 mg | Freq: Two times a day (BID) | INTRAVENOUS | Status: DC
  Administered 2021-06-05 – 2021-06-06 (×2): 500 mg via INTRAVENOUS
  Filled 2021-06-05 (×3): qty 100

## 2021-06-05 MED ORDER — DEXTROSE IN LACTATED RINGERS 5 % IV SOLN: INTRAVENOUS | Status: DC

## 2021-06-05 MED ORDER — ZINC OXIDE 40 % EX OINT
TOPICAL_OINTMENT | Freq: Two times a day (BID) | CUTANEOUS | Status: DC
  Filled 2021-06-05: qty 57

## 2021-06-05 MED ORDER — METRONIDAZOLE 500 MG PO TABS
500.0000 mg | ORAL_TABLET | Freq: Two times a day (BID) | ORAL | Status: DC
  Filled 2021-06-05: qty 1

## 2021-06-05 NOTE — Interval H&P Note (Signed)
History and Physical Interval Note:  06/05/2021 3:52 PM  Brooke Mckee  has presented today for surgery, with the diagnosis of Diarrhea.  Question pouchitis..  The various methods of treatment have been discussed with the patient and family. After consideration of risks, benefits and other options for treatment, the patient has consented to  Procedure(s) with comments: FLEXIBLE SIGMOIDOSCOPY (N/A) - unsedated procedure as a surgical intervention.  The patient's history has been reviewed, patient examined, no change in status, stable for surgery.  I have reviewed the patient's chart and labs.  Questions were answered to the patient's satisfaction.     Dominic Pea Amica Harron

## 2021-06-05 NOTE — Progress Notes (Signed)
Patient requesting medication for "raw bottom" from her diarrhea. [facility] call service called to request order. Dr. Ardis Hughs returning call and giving order for Desitin.

## 2021-06-05 NOTE — Consult Note (Addendum)
Attending physician's note   I have taken an interval history, reviewed the chart and examined the patient. I agree with the Advanced Practitioner's note, impression, and recommendations as outlined.   30 year old female, currently [redacted] weeks gestation, with medical history as outlined below, notably history of Ulcerative Colitis s/p laparoscopic total colectomy with ileostomy in 2012 and subsequent reversal with J-pouch, presenting with 3 days of watery, nonbloody stools and nausea.  No current emesis.  Did have recent URI 3 weeks ago, but diarrheal symptoms had resolved until recurrence about 3 days ago.  Now with 20 BMs/day.  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  She is otherwise not on any maintenance therapy for history of IBD.  Last endoscopic evaluation was approximately 5 years ago per patient.  Additionally, has strong family history of IBD and CRC as outlined below.  Admission evaluation notable for the following: - WC 10, H/H 12.8/39 - Na 131, otherwise normal CMP - CT abdomen/pelvis: Colectomy with ileal anal anastomosis without active inflammation.  Moderate dilation of the small bowel proximal to the anastomosis  1) Diarrhea 2) History of UC s/p total colectomy with ileal anal anastomosis/J-pouch 3) History of pouchitis 4) Hyponatremia 5) Nausea  - Plan for urgent pouchoscopy today - Check GI PCR panel, CRP, fecal calprotectin - Additional recommendations pending endoscopic findings - Hyponatremia management per primary service - General Surgery service consulted and following - Antiemetics prn as ordered  The indications, risks, and benefits of flexible sigmoidoscopy/pouchoscopy were explained to the patient in detail. Risks include but are not limited to bleeding, perforation, adverse  reaction to medications, and cardiopulmonary compromise. Sequelae include but are not limited to the possibility of surgery, hospitalization, and mortality. The patient verbalized understanding and wished to proceed. All questions answered.   Cephas Darby, [place] [phone] office                                                                                   [facility] Consult: 8:37 AM 06/05/2021  LOS: 1 day    Referring Provider: Barkley Boards PA-C  Primary Care Physician: OB/GYN.  Dr Emeterio Reeve.   Primary Gastroenterologist:  None.  Unassigned.       Reason for Consultation: Diarrhea, nausea, vomiting, PSBO   HPI: Brooke Mckee is a 30 y.o. female.  PMH UC.  Laparoscopic 2012 total abdominal colectomy/ileostomy, subsequent ileostomy reversal J-pouch formation .  Incidences of pouchitis over the years treated with courses of Cipro/Flagyl.  Sometimes manifesting with obstructive symptoms treated with NG and rectal tube.  Pt reports that obstructive symptoms or sometimes treated with enemas with good relief of symptoms..  Also has had C. difficile.  Flex sig 11/2015 confirming pouchitis treated with Cipro/Fl.  G2 P1-0-0-1.  [redacted] weeks pregnant.  Previous C-section.  Anemia, has taken iron for many years, received parenteral iron in the past and PRBC transfusion in 2012.  About 3 weeks ago patient had RSV bronchitis associated with diarrhea.  Diarrhea resolved and bowel habits resumed her twice daily applesauce consistency, nonbloody pattern.  First about 3 days PTA had recurrent nonbloody diarrhea  up to 20 episodes in a 24-hour period.  Nonbloody nausea and vomiting.  Cramping pain across the upper abdomen, mostly on the right.  Symptoms very much consistent with previous episodes of pouchitis which have been treated with courses of Cipro and Flagyl periodically. CTAP w contrast: S/p colectomy and ileoanal anastomosis.  Moderate dilation of SB proximal to anastomosis  s/O P SBO.  No inflammation of J-pouch or SB. Na 131.  BUN/creatinine, LFTs, lipase normal. Hgb 9.8 >> 12.8 on recheck.  MCV 85.  WBCs, platelets normal.  Surgery seeking GI input regarding possible flex sig, rule out pouchitis.  They have elected not to place NG tube.  Pt n.p.o. on IV fluids.  No antibiotics.  Strong family history of IBD.  Her uncle had UC, died with colon cancer.  Her father has had UC since he was a youngster.  She has a sister who was diagnosed with Crohn's disease as an adult. Currently pregnant with her second child.  No alcohol.  No tobacco products.  Husband is a Doctor, general practice who recently took a position and moved to [place].  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    Past Medical History:  Diagnosis Date  . Migraines   . Ulcerative colitis [facility])     Past Surgical History:  Procedure Laterality Date  . CESAREAN SECTION    . COLON SURGERY    . COLOPROCTECTOMY W/ ILEO J POUCH  2012   for UC  . TOTAL COLECTOMY  2012   with IPAA    Prior to Admission medications   Medication Sig Start Date End Date Taking? Authorizing Provider  albuterol (VENTOLIN HFA) 108 (90 Base) MCG/ACT inhaler Inhale 1-2 puffs into the lungs every 6 (six) hours as needed for wheezing or shortness of breath. 05/23/21  Yes Marney Setting, NP  ferrous sulfate 325 (65 FE) MG tablet Take 325 mg by mouth daily with breakfast.   Yes [provider]  ondansetron (ZOFRAN-ODT) 8 MG disintegrating tablet Take 8 mg by mouth every 8 (eight) hours as needed for nausea/vomiting. 06/03/21  Yes [provider]  Prenatal Vit-Fe Fumarate-FA (PRENATAL VITAMINS PO) Take 1 tablet by mouth daily.   Yes [provider]  ciprofloxacin (CIPRO) 500 MG tablet Take 500 mg by mouth 2 (two) times daily. 7 day supply Patient not taking: Reported on 06/04/2021 06/03/21   [provider]     Scheduled Meds:  Infusions: . dextrose 5% lactated ringers 100 mL/hr at 06/05/21 0636  . lactated ringers 100 mL/hr at 06/05/21 0311   PRN Meds: acetaminophen, ondansetron (ZOFRAN) IV   Allergies as of 06/04/2021 - Review Complete 06/04/2021  Allergen Reaction Noted  . Morphine Hives 07/10/2020    No family history on file.  Social History   Socioeconomic History  . Marital status: Married    Spouse name: Not on file  . Number of children: Not on file  . Years of education: Not on file  . Highest education level: Not on file  Occupational History  . Not on file  Tobacco Use  . Smoking status: Never  . Smokeless tobacco: Never  Substance and Sexual Activity  . Alcohol use: Not on file  . Drug use: Not on file  . Sexual activity: Not on file  Other Topics Concern  . Not on file  Social History Narrative  . Not on file   Social Determinants of Health  Financial Resource Strain: Not on file  Food Insecurity: No Food Insecurity  . Worried About Charity fundraiser in the Last Year: Never true  . Ran Out of Food in the Last Year: Never true  Transportation Needs: No Transportation Needs  . Lack of Transportation (Medical): No  . Lack of Transportation (Non-Medical): No  Physical Activity: Not on file  Stress: Not on file  Social Connections: Not on file  Intimate Partner Violence: Not on file    REVIEW OF SYSTEMS: Constitutional: Tired but no profound fatigue or weakness. ENT:  No nose bleeds Pulm: No shortness of breath.  Lingering, nonsevere, dry cough CV:  No palpitations, no LE edema.  No angina GU:  No hematuria, no frequency GI: See HPI. Heme: Denies excessive or unusual bleeding. Transfusions: See HPI. Neuro:  No headaches, no peripheral tingling or numbness Derm:  No itching, no rash or sores.  Endocrine:  No sweats or chills.  No polyuria or dysuria Immunization: Reviewed Travel:  None beyond local counties in last few months.     PHYSICAL EXAM: Vital signs in last 24 hours: Vitals:   06/05/21 0351 06/05/21 0742  BP: 127/81 (!) 110/57  Pulse: 88 83  Resp: 16   Temp: 97.8 F (36.6 C) 98.5 F (36.9 C)  SpO2: 100% 100%   Wt Readings from Last 3 Encounters:  05/21/21 69.1 kg  05/03/21 70.4 kg  04/17/21 67.6 kg    General: Well-appearing, comfortable, pleasant Head: No facial asymmetry or swelling.  No signs of head trauma. Eyes: Conjunctiva pink.  EOMI. Ears: Not hard of hearing Nose: No congestion or discharge. Mouth: Good dentition.  Mucosa moist, pink, clear.  Tongue midline. Neck: No JVD, no thyromegaly, no masses. Lungs: Clear bilaterally.  Slight, occasional, brief dry cough. Heart: RRR. Abdomen: Soft.  Not tender.  No HSM, masses, bruits, hernias.  Mild protruberence/baby bump consistent with pregnancy.  Well-healed surgical scars. Rectal: Deferred Musc/Skeltl: No joint redness, swelling or gross deformity. Extremities: No CCE. Neurologic: Oriented x3.  Good historian.  Moves all 4 limbs.  No tremor.  No gross deficits. Skin: No rash, no sores, no suspicious lesions.   Nodes: No cervical adenopathy Psych: Cooperative, calm, pleasant.  Intake/Output from previous day: 11/07 0701 - 11/08 0700 In: -  Out: 300 [Urine:300] Intake/Output this shift: No intake/output data recorded.  LAB RESULTS: Recent Labs    06/04/21 1004  WBC 10.1  HGB 12.8  HCT 39.7  PLT 334   BMET Lab Results  Component Value Date   NA 131 (L) 06/04/2021   K 4.3 06/04/2021   CL 100 06/04/2021   CO2 18 (L) 06/04/2021   GLUCOSE 90 06/04/2021   BUN 9 06/04/2021   CREATININE 0.71 06/04/2021   CALCIUM 9.4 06/04/2021   LFT Recent Labs    06/04/21 1004  PROT 7.7  ALBUMIN 3.5  AST 27  ALT 12  ALKPHOS 90  BILITOT 0.9   PT/INR No results found for: INR, PROTIME Hepatitis Panel No results for input(s): HEPBSAG, HCVAB, HEPAIGM, HEPBIGM in the last 72 hours. C-Diff No components found for: CDIFF Lipase      Component Value Date/Time   LIPASE 39 06/04/2021 1004    Drugs of Abuse  No results found for: LABOPIA, COCAINSCRNUR, LABBENZ, AMPHETMU, THCU, LABBARB   RADIOLOGY STUDIES: CT Abdomen Pelvis W Contrast  Result Date: 06/04/2021 CLINICAL DATA:  Bowel obstruction suspected. History of ulcerative colitis. EXAM: CT ABDOMEN AND PELVIS WITH CONTRAST TECHNIQUE: Multidetector CT imaging  of the abdomen and pelvis was performed using the standard protocol following bolus administration of intravenous contrast. CONTRAST:  80mL OMNIPAQUE IOHEXOL 300 MG/ML  SOLN COMPARISON:  None. FINDINGS: Lower chest: Clear lung bases. Normal heart size without pericardial or pleural effusion. Hepatobiliary: Normal liver. The gallbladder is possibly identified on 20/3, surrounded by small bowel loops. No calcified stone or surrounding inflammation. No biliary duct dilatation. Pancreas: Normal, without mass or ductal dilatation. Spleen: Normal in size, without focal abnormality. Adrenals/Urinary Tract: Normal adrenal glands. Normal kidneys, without hydronephrosis. Decompressed urinary bladder. Stomach/Bowel: Normal stomach, without wall thickening. Status post colectomy and ileoanal anastomosis with J-pouch. No inflammation of the J-pouch, which is fluid-filled. Proximal small bowel is normal in caliber. The distal small bowel just proximal to the anastomosis is dilated and fluid-filled within the right-side of the abdomen, including at 6.4 cm on coronal image 58. Vascular/Lymphatic: Normal caliber of the aorta and branch vessels. No abdominopelvic adenopathy. Reproductive: Intrauterine pregnancy which is cephalic. The placenta is positioned superiorly and anteriorly. No dominant adnexal mass. Other: No abdominal ascites or free intraperitoneal air. No pelvic fluid. Musculoskeletal: Proximal left femur fixation. IMPRESSION: 1. Status post colectomy and ileoanal anastomosis. The small bowel proximal to the anastomosis is moderately  dilated, suggesting partial small bowel obstruction. No evidence of inflammation involving the J-pouch or the small bowel. 2. No other explanation for abdominal pain. Electronically Signed   By: Abigail Miyamoto M.D.   On: 06/04/2021 15:36      IMPRESSION:      PSBO w presumed pouchitis.  ? Empirically tret w abx vs investigate/verify pouchitis w flex sig?    Hx UC.  S/p total colectomy, ileostomy and later J pouch  2012    Hx anemia. On chronic po iron.  Parenteral iron and PRBCs in past. Not anemic currently.      Hyponatremia.      PLAN:       Per Dr Bryan Lemma.     Brooke Mckee  06/05/2021, 8:37 AM Phone [phone]

## 2021-06-05 NOTE — Progress Notes (Signed)
FACULTY PRACTICE ANTEPARTUM PROGRESS NOTE  Brooke Mckee is a 30 y.o. G2P1001 at [redacted]w[redacted]d who is admitted for partial small bowel obstruction.  Estimated Date of Delivery: 07/22/21 Fetal presentation is cephalic.  Length of Stay:  1 Days. Admitted 06/04/2021  Subjective: Pt stable overnight.  She notes continued diarrhea.  There is still some nausea, but no further emesis. Patient reports normal fetal movement.  She notes occasional uterine contractions, denies bleeding and leaking of fluid per vagina.  Vitals:  Blood pressure 127/81, pulse 88, temperature 97.8 F (36.6 C), temperature source Oral, resp. rate 16, SpO2 100 %. Physical Examination: CONSTITUTIONAL: Well-developed, well-nourished female in no acute distress.  HENT:  Normocephalic, atraumatic, External right and left ear normal. Oropharynx is clear and moist EYES: Conjunctivae and EOM are normal.  NECK: Normal range of motion, supple, no masses. SKIN: Skin is warm and dry. No rash noted. Not diaphoretic. No erythema. No pallor. [place]: Alert and oriented to person, place, and time. Normal reflexes, muscle tone coordination. No cranial nerve deficit noted. PSYCHIATRIC: Normal mood and affect. Normal behavior. Normal judgment and thought content. CARDIOVASCULAR: Normal heart rate noted, regular rhythm RESPIRATORY: Effort and breath sounds normal, no problems with respiration noted MUSCULOSKELETAL: Normal range of motion. No edema and no tenderness. ABDOMEN: Soft, nontender, nondistended, gravid.hypoactive bowel sounds CERVIX: deferred  Fetal monitoring: FHR: 145 bpm (last recorded) Uterine activity:irregular  Results for orders placed or performed during the hospital encounter of 06/04/21 (from the past 48 hour(s))  Comprehensive metabolic panel     Status: Abnormal   Collection Time: 06/04/21 10:04 AM  Result Value Ref Range   Sodium 131 (L) 135 - 145 mmol/L   Potassium 4.3 3.5 - 5.1 mmol/L   Chloride 100 98 - 111 mmol/L    CO2 18 (L) 22 - 32 mmol/L   Glucose, Bld 90 70 - 99 mg/dL    Comment: Glucose reference range applies only to samples taken after fasting for at least 8 hours.   BUN 9 6 - 20 mg/dL   Creatinine, Ser 0.71 0.44 - 1.00 mg/dL   Calcium 9.4 8.9 - 10.3 mg/dL   Total Protein 7.7 6.5 - 8.1 g/dL   Albumin 3.5 3.5 - 5.0 g/dL   AST 27 15 - 41 U/L   ALT 12 0 - 44 U/L   Alkaline Phosphatase 90 38 - 126 U/L   Total Bilirubin 0.9 0.3 - 1.2 mg/dL   GFR, Estimated >60 >60 mL/min    Comment: (NOTE) Calculated using the CKD-EPI Creatinine Equation (2021)    Anion gap 13 5 - 15    Comment: Performed at [facility] [address] [place], [place] [address]  CBC with Differential     Status: Abnormal   Collection Time: 06/04/21 10:04 AM  Result Value Ref Range   WBC 10.1 4.0 - 10.5 K/uL   RBC 4.65 3.87 - 5.11 MIL/uL   Hemoglobin 12.8 12.0 - 15.0 g/dL   HCT 39.7 36.0 - 46.0 %   MCV 85.4 80.0 - 100.0 fL   MCH 27.5 26.0 - 34.0 pg   MCHC 32.2 30.0 - 36.0 g/dL   RDW 17.0 (H) 11.5 - 15.5 %   Platelets 334 150 - 400 K/uL   nRBC 0.0 0.0 - 0.2 %   Neutrophils Relative % 84 %   Neutro Abs 8.4 (H) 1.7 - 7.7 K/uL   Lymphocytes Relative 8 %   Lymphs Abs 0.8 0.7 - 4.0 K/uL   Monocytes Relative  7 %   Monocytes Absolute 0.7 0.1 - 1.0 K/uL   Eosinophils Relative 0 %   Eosinophils Absolute 0.0 0.0 - 0.5 K/uL   Basophils Relative 0 %   Basophils Absolute 0.0 0.0 - 0.1 K/uL   Immature Granulocytes 1 %   Abs Immature Granulocytes 0.05 0.00 - 0.07 K/uL    Comment: Performed at [facility] [address] [place], [place] [address]  Lipase, blood     Status: None   Collection Time: 06/04/21 10:04 AM  Result Value Ref Range   Lipase 39 11 - 51 U/L    Comment: Performed at [facility] [address], [place], [place] [address]  Urinalysis, Routine w reflex microscopic Urine, Clean Catch     Status: Abnormal   Collection Time: 06/04/21  4:11 PM  Result Value Ref Range   Color, Urine  YELLOW YELLOW   APPearance HAZY (A) CLEAR   Specific Gravity, Urine >1.046 (H) 1.005 - 1.030   pH 5.0 5.0 - 8.0   Glucose, UA NEGATIVE NEGATIVE mg/dL   Hgb urine dipstick NEGATIVE NEGATIVE   Bilirubin Urine NEGATIVE NEGATIVE   Ketones, ur 80 (A) NEGATIVE mg/dL   Protein, ur 30 (A) NEGATIVE mg/dL   Nitrite NEGATIVE NEGATIVE   Leukocytes,Ua NEGATIVE NEGATIVE   RBC / HPF 0-5 0 - 5 RBC/hpf   WBC, UA 0-5 0 - 5 WBC/hpf   Bacteria, UA RARE (A) NONE SEEN   Squamous Epithelial / LPF 11-20 0 - 5   Mucus PRESENT     Comment: Performed at [facility], [place] [address] [place], [place] [address]  Resp Panel by RT-PCR (Flu A&B, Covid) Nasopharyngeal Swab     Status: None   Collection Time: 06/04/21  5:04 PM   Specimen: Nasopharyngeal Swab; Nasopharyngeal(NP) swabs in vial transport medium  Result Value Ref Range   SARS Coronavirus 2 by RT PCR NEGATIVE NEGATIVE    Comment: (NOTE) SARS-CoV-2 target nucleic acids are NOT DETECTED.  The SARS-CoV-2 RNA is generally detectable in upper respiratory specimens during the acute phase of infection. The lowest concentration of SARS-CoV-2 viral copies this assay can detect is 138 copies/mL. A negative result does not preclude SARS-Cov-2 infection and should not be used as the sole basis for treatment or other patient management decisions. A negative result may occur with  improper specimen collection/handling, submission of specimen other than nasopharyngeal swab, presence of viral mutation(s) within the areas targeted by this assay, and inadequate number of viral copies(<138 copies/mL). A negative result must be combined with clinical observations, patient history, and epidemiological information. The expected result is Negative.  Fact Sheet for Patients:  EntrepreneurPulse.com.au  Fact Sheet for Healthcare Providers:  IncredibleEmployment.be  This test is no t yet approved or cleared by the [place]  FDA and  has been authorized for detection and/or diagnosis of SARS-CoV-2 by FDA under an Emergency Use Authorization (EUA). This EUA will remain  in effect (meaning this test can be used) for the duration of the COVID-19 declaration under Section 564(b)(1) of the Act, 21 U.S.C.section 360bbb-3(b)(1), unless the authorization is terminated  or revoked sooner.       Influenza A by PCR NEGATIVE NEGATIVE   Influenza B by PCR NEGATIVE NEGATIVE    Comment: (NOTE) The Xpert Xpress SARS-CoV-2/FLU/RSV plus assay is intended as an aid in the diagnosis of influenza from Nasopharyngeal swab specimens and should not be used as a sole basis for treatment. Nasal washings  and aspirates are unacceptable for Xpert Xpress SARS-CoV-2/FLU/RSV testing.  Fact Sheet for Patients: EntrepreneurPulse.com.au  Fact Sheet for Healthcare Providers: IncredibleEmployment.be  This test is not yet approved or cleared by the [place] FDA and has been authorized for detection and/or diagnosis of SARS-CoV-2 by FDA under an Emergency Use Authorization (EUA). This EUA will remain in effect (meaning this test can be used) for the duration of the COVID-19 declaration under Section 564(b)(1) of the Act, 21 U.S.C. section 360bbb-3(b)(1), unless the authorization is terminated or revoked.  Performed at [facility], [place] [address], [place], [place] [address]   Type and screen [place]     Status: None   Collection Time: 06/04/21  7:14 PM  Result Value Ref Range   ABO/RH(D) O POS    Antibody Screen NEG    Sample Expiration      06/07/2021,2359 Performed at [facility], [place] [address] [place], [place] [address]     I have reviewed the patient's current medications.  ASSESSMENT: Active Problems:   Partial small bowel obstruction (HCC)   PLAN: Daily NST with FHT q shift Bowel management per General surgery Remain NPO for now,continue  IV fluids   Continue routine antenatal care.   Lynnda Shields, MD [place] Attending, [facility] 06/05/2021 7:37 AM

## 2021-06-05 NOTE — Progress Notes (Signed)
1400- GI MD at bedside disusing with patient POC, procedure planned for 1500. 1410-Fleets enema given.

## 2021-06-05 NOTE — Op Note (Signed)
[facility] Patient Name: Brooke Mckee Procedure Date : 06/05/2021 MRN: [id number] Attending MD: Gerrit Heck , MD Date of Birth: 1990-11-13 CSN: [id number] Age: 30 Admit Type: Inpatient Procedure:                Flexible Sigmoidoscopy/Pouchoscopy Indications:              Ulcerative colitis s/p colectomy and IPAA in 2012,                            admitted with watery, non-bloody diarrhea Providers:                Gerrit Heck, MD, Jeanella Cara, RN,                            Tyna Jaksch Technician Referring MD:              Medicines:                None Complications:            No immediate complications. Estimated Blood Loss:     Estimated blood loss was minimal. Procedure:                Pre-Anesthesia Assessment:                           - Prior to the procedure, a History and Physical                            was performed, and patient medications and                            allergies were reviewed. The patient's tolerance of                            previous anesthesia was also reviewed. The risks                            and benefits of the procedure and the sedation                            options and risks were discussed with the patient.                            All questions were answered, and informed consent                            was obtained. Prior Anticoagulants: The patient has                            taken no previous anticoagulant or antiplatelet                            agents. ASA Grade Assessment: II - A patient with  mild systemic disease. After reviewing the risks                            and benefits, the patient was deemed in                            satisfactory condition to undergo the procedure.                           After obtaining informed consent, the scope was                            passed under direct vision. The [id number]-[id number] ([id number])                             Olympus endoscope was introduced through the anus                            and advanced to the the ileo-rectal anastomosis.                            The flexible sigmoidoscopy was accomplished without                            difficulty. The patient tolerated the procedure                            well. The quality of the bowel preparation was good. Scope In: Scope Out: Findings:      The perianal and digital rectal examinations were normal.      There was evidence of a prior ileal pouch-anal anastomosis in the       rectum. This was patent and was characterized by edema, erythema and       ulceration in the distal pouch. The anastomosis was traversed. Biopsies       were taken with a cold forceps for histology. Estimated blood loss was       minimal. The cuff and dentate line otherwise appeared normal. The       endoscope was advanced approximately 15 cm into the small bowel, and was       normal appearing and patent. This was biopsied. Impression:               - Patent ileal pouch-anal anastomosis,                            characterized by edema, erythema and ulceration.                            This is characterized as mild pouchitis. This was                            biopsied.                           - No evidence of cuffitis.                           -  The neo-terminal ileum is otherwise normal                            appearing. There were no strictures or areas of                            luminal narrowing noted. This was biopsied. Recommendation:           - Return patient to hospital ward for ongoing care.                           - Advance diet as tolerated.                           - Will discuss with primary OB service about                            initiating antibiotics for suspected pouchitis.                           - Check GI PCR panel, CRP, and fecal calprotectin. Procedure Code(s):        --- Professional ---                            [phone], Sigmoidoscopy, flexible; with biopsy, single                            or multiple Diagnosis Code(s):        --- Professional ---                           Z98.0, Intestinal bypass and anastomosis status                           K51.90, Ulcerative colitis, unspecified, without                            complications                           R19.7, Diarrhea, unspecified CPT copyright 2019 American Medical Association. All rights reserved. The codes documented in this report are preliminary and upon coder review may  be revised to meet current compliance requirements. Gerrit Heck, MD 06/05/2021 4:24:47 PM Number of Addenda: 0

## 2021-06-05 NOTE — H&P (View-Only) (Signed)
Attending physician's note   I have taken an interval history, reviewed the chart and examined the patient. I agree with the Advanced Practitioner's note, impression, and recommendations as outlined.   30 year old female, currently [redacted] weeks gestation, with medical history as outlined below, notably history of Ulcerative Colitis s/p laparoscopic total colectomy with ileostomy in 2012 and subsequent reversal with J-pouch, presenting with 3 days of watery, nonbloody stools and nausea.  No current emesis.  Did have recent URI 3 weeks ago, but diarrheal symptoms had resolved until recurrence about 3 days ago.  Now with 20 BMs/day.  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  She is otherwise not on any maintenance therapy for history of IBD.  Last endoscopic evaluation was approximately 5 years ago per patient.  Additionally, has strong family history of IBD and CRC as outlined below.  Admission evaluation notable for the following: - WC 10, H/H 12.8/39 - Na 131, otherwise normal CMP - CT abdomen/pelvis: Colectomy with ileal anal anastomosis without active inflammation.  Moderate dilation of the small bowel proximal to the anastomosis  1) Diarrhea 2) History of UC s/p total colectomy with ileal anal anastomosis/J-pouch 3) History of pouchitis 4) Hyponatremia 5) Nausea  - Plan for urgent pouchoscopy today - Check GI PCR panel, CRP, fecal calprotectin - Additional recommendations pending endoscopic findings - Hyponatremia management per primary service - General Surgery service consulted and following - Antiemetics prn as ordered  The indications, risks, and benefits of flexible sigmoidoscopy/pouchoscopy were explained to the patient in detail. Risks include but are not limited to bleeding, perforation, adverse  reaction to medications, and cardiopulmonary compromise. Sequelae include but are not limited to the possibility of surgery, hospitalization, and mortality. The patient verbalized understanding and wished to proceed. All questions answered.   Cephas Darby, [place] [phone] office                                                                                   [facility] Consult: 8:37 AM 06/05/2021  LOS: 1 day    Referring Provider: Barkley Boards PA-C  Primary Care Physician: OB/GYN.  Dr Emeterio Reeve.   Primary Gastroenterologist:  None.  Unassigned.       Reason for Consultation: Diarrhea, nausea, vomiting, PSBO   HPI: Brooke Mckee is a 30 y.o. female.  PMH UC.  Laparoscopic 2012 total abdominal colectomy/ileostomy, subsequent ileostomy reversal J-pouch formation .  Incidences of pouchitis over the years treated with courses of Cipro/Flagyl.  Sometimes manifesting with obstructive symptoms treated with NG and rectal tube.  Pt reports that obstructive symptoms or sometimes treated with enemas with good relief of symptoms..  Also has had C. difficile.  Flex sig 11/2015 confirming pouchitis treated with Cipro/Fl.  G2 P1-0-0-1.  [redacted] weeks pregnant.  Previous C-section.  Anemia, has taken iron for many years, received parenteral iron in the past and PRBC transfusion in 2012.  About 3 weeks ago patient had RSV bronchitis associated with diarrhea.  Diarrhea resolved and bowel habits resumed her twice daily applesauce consistency, nonbloody pattern.  First about 3 days PTA had recurrent nonbloody diarrhea  up to 20 episodes in a 24-hour period.  Nonbloody nausea and vomiting.  Cramping pain across the upper abdomen, mostly on the right.  Symptoms very much consistent with previous episodes of pouchitis which have been treated with courses of Cipro and Flagyl periodically. CTAP w contrast: S/p colectomy and ileoanal anastomosis.  Moderate dilation of SB proximal to anastomosis  s/O P SBO.  No inflammation of J-pouch or SB. Na 131.  BUN/creatinine, LFTs, lipase normal. Hgb 9.8 >> 12.8 on recheck.  MCV 85.  WBCs, platelets normal.  Surgery seeking GI input regarding possible flex sig, rule out pouchitis.  They have elected not to place NG tube.  Pt n.p.o. on IV fluids.  No antibiotics.  Strong family history of IBD.  Her uncle had UC, died with colon cancer.  Her father has had UC since he was a youngster.  She has a sister who was diagnosed with Crohn's disease as an adult. Currently pregnant with her second child.  No alcohol.  No tobacco products.  Husband is a Doctor, general practice who recently took a position and moved to [place].  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    Past Medical History:  Diagnosis Date  . Migraines   . Ulcerative colitis [facility])     Past Surgical History:  Procedure Laterality Date  . CESAREAN SECTION    . COLON SURGERY    . COLOPROCTECTOMY W/ ILEO J POUCH  2012   for UC  . TOTAL COLECTOMY  2012   with IPAA    Prior to Admission medications   Medication Sig Start Date End Date Taking? Authorizing Provider  albuterol (VENTOLIN HFA) 108 (90 Base) MCG/ACT inhaler Inhale 1-2 puffs into the lungs every 6 (six) hours as needed for wheezing or shortness of breath. 05/23/21  Yes Marney Setting, NP  ferrous sulfate 325 (65 FE) MG tablet Take 325 mg by mouth daily with breakfast.   Yes [provider]  ondansetron (ZOFRAN-ODT) 8 MG disintegrating tablet Take 8 mg by mouth every 8 (eight) hours as needed for nausea/vomiting. 06/03/21  Yes [provider]  Prenatal Vit-Fe Fumarate-FA (PRENATAL VITAMINS PO) Take 1 tablet by mouth daily.   Yes [provider]  ciprofloxacin (CIPRO) 500 MG tablet Take 500 mg by mouth 2 (two) times daily. 7 day supply Patient not taking: Reported on 06/04/2021 06/03/21   [provider]     Scheduled Meds:  Infusions: . dextrose 5% lactated ringers 100 mL/hr at 06/05/21 0636  . lactated ringers 100 mL/hr at 06/05/21 0311   PRN Meds: acetaminophen, ondansetron (ZOFRAN) IV   Allergies as of 06/04/2021 - Review Complete 06/04/2021  Allergen Reaction Noted  . Morphine Hives 07/10/2020    No family history on file.  Social History   Socioeconomic History  . Marital status: Married    Spouse name: Not on file  . Number of children: Not on file  . Years of education: Not on file  . Highest education level: Not on file  Occupational History  . Not on file  Tobacco Use  . Smoking status: Never  . Smokeless tobacco: Never  Substance and Sexual Activity  . Alcohol use: Not on file  . Drug use: Not on file  . Sexual activity: Not on file  Other Topics Concern  . Not on file  Social History Narrative  . Not on file   Social Determinants of Health  Financial Resource Strain: Not on file  Food Insecurity: No Food Insecurity  . Worried About Charity fundraiser in the Last Year: Never true  . Ran Out of Food in the Last Year: Never true  Transportation Needs: No Transportation Needs  . Lack of Transportation (Medical): No  . Lack of Transportation (Non-Medical): No  Physical Activity: Not on file  Stress: Not on file  Social Connections: Not on file  Intimate Partner Violence: Not on file    REVIEW OF SYSTEMS: Constitutional: Tired but no profound fatigue or weakness. ENT:  No nose bleeds Pulm: No shortness of breath.  Lingering, nonsevere, dry cough CV:  No palpitations, no LE edema.  No angina GU:  No hematuria, no frequency GI: See HPI. Heme: Denies excessive or unusual bleeding. Transfusions: See HPI. Neuro:  No headaches, no peripheral tingling or numbness Derm:  No itching, no rash or sores.  Endocrine:  No sweats or chills.  No polyuria or dysuria Immunization: Reviewed Travel:  None beyond local counties in last few months.     PHYSICAL EXAM: Vital signs in last 24 hours: Vitals:   06/05/21 0351 06/05/21 0742  BP: 127/81 (!) 110/57  Pulse: 88 83  Resp: 16   Temp: 97.8 F (36.6 C) 98.5 F (36.9 C)  SpO2: 100% 100%   Wt Readings from Last 3 Encounters:  05/21/21 69.1 kg  05/03/21 70.4 kg  04/17/21 67.6 kg    General: Well-appearing, comfortable, pleasant Head: No facial asymmetry or swelling.  No signs of head trauma. Eyes: Conjunctiva pink.  EOMI. Ears: Not hard of hearing Nose: No congestion or discharge. Mouth: Good dentition.  Mucosa moist, pink, clear.  Tongue midline. Neck: No JVD, no thyromegaly, no masses. Lungs: Clear bilaterally.  Slight, occasional, brief dry cough. Heart: RRR. Abdomen: Soft.  Not tender.  No HSM, masses, bruits, hernias.  Mild protruberence/baby bump consistent with pregnancy.  Well-healed surgical scars. Rectal: Deferred Musc/Skeltl: No joint redness, swelling or gross deformity. Extremities: No CCE. Neurologic: Oriented x3.  Good historian.  Moves all 4 limbs.  No tremor.  No gross deficits. Skin: No rash, no sores, no suspicious lesions.   Nodes: No cervical adenopathy Psych: Cooperative, calm, pleasant.  Intake/Output from previous day: 11/07 0701 - 11/08 0700 In: -  Out: 300 [Urine:300] Intake/Output this shift: No intake/output data recorded.  LAB RESULTS: Recent Labs    06/04/21 1004  WBC 10.1  HGB 12.8  HCT 39.7  PLT 334   BMET Lab Results  Component Value Date   NA 131 (L) 06/04/2021   K 4.3 06/04/2021   CL 100 06/04/2021   CO2 18 (L) 06/04/2021   GLUCOSE 90 06/04/2021   BUN 9 06/04/2021   CREATININE 0.71 06/04/2021   CALCIUM 9.4 06/04/2021   LFT Recent Labs    06/04/21 1004  PROT 7.7  ALBUMIN 3.5  AST 27  ALT 12  ALKPHOS 90  BILITOT 0.9   PT/INR No results found for: INR, PROTIME Hepatitis Panel No results for input(s): HEPBSAG, HCVAB, HEPAIGM, HEPBIGM in the last 72 hours. C-Diff No components found for: CDIFF Lipase      Component Value Date/Time   LIPASE 39 06/04/2021 1004    Drugs of Abuse  No results found for: LABOPIA, COCAINSCRNUR, LABBENZ, AMPHETMU, THCU, LABBARB   RADIOLOGY STUDIES: CT Abdomen Pelvis W Contrast  Result Date: 06/04/2021 CLINICAL DATA:  Bowel obstruction suspected. History of ulcerative colitis. EXAM: CT ABDOMEN AND PELVIS WITH CONTRAST TECHNIQUE: Multidetector CT imaging  of the abdomen and pelvis was performed using the standard protocol following bolus administration of intravenous contrast. CONTRAST:  81mL OMNIPAQUE IOHEXOL 300 MG/ML  SOLN COMPARISON:  None. FINDINGS: Lower chest: Clear lung bases. Normal heart size without pericardial or pleural effusion. Hepatobiliary: Normal liver. The gallbladder is possibly identified on 20/3, surrounded by small bowel loops. No calcified stone or surrounding inflammation. No biliary duct dilatation. Pancreas: Normal, without mass or ductal dilatation. Spleen: Normal in size, without focal abnormality. Adrenals/Urinary Tract: Normal adrenal glands. Normal kidneys, without hydronephrosis. Decompressed urinary bladder. Stomach/Bowel: Normal stomach, without wall thickening. Status post colectomy and ileoanal anastomosis with J-pouch. No inflammation of the J-pouch, which is fluid-filled. Proximal small bowel is normal in caliber. The distal small bowel just proximal to the anastomosis is dilated and fluid-filled within the right-side of the abdomen, including at 6.4 cm on coronal image 58. Vascular/Lymphatic: Normal caliber of the aorta and branch vessels. No abdominopelvic adenopathy. Reproductive: Intrauterine pregnancy which is cephalic. The placenta is positioned superiorly and anteriorly. No dominant adnexal mass. Other: No abdominal ascites or free intraperitoneal air. No pelvic fluid. Musculoskeletal: Proximal left femur fixation. IMPRESSION: 1. Status post colectomy and ileoanal anastomosis. The small bowel proximal to the anastomosis is moderately  dilated, suggesting partial small bowel obstruction. No evidence of inflammation involving the J-pouch or the small bowel. 2. No other explanation for abdominal pain. Electronically Signed   By: Abigail Miyamoto M.D.   On: 06/04/2021 15:36      IMPRESSION:      PSBO w presumed pouchitis.  ? Empirically tret w abx vs investigate/verify pouchitis w flex sig?    Hx UC.  S/p total colectomy, ileostomy and later J pouch  2012    Hx anemia. On chronic po iron.  Parenteral iron and PRBCs in past. Not anemic currently.      Hyponatremia.      PLAN:       Per Dr Bryan Lemma.     Azucena Freed  06/05/2021, 8:37 AM Phone [phone]

## 2021-06-05 NOTE — Progress Notes (Signed)
Progress Note     Subjective: Patient reports she has had more diarrhea overnight and rectal leakage. She reports some nausea but no vomiting. She has some cramping along R abdomen.   Objective: Vital signs in last 24 hours: Temp:  [97.8 F (36.6 C)-98.5 F (36.9 C)] 98.5 F (36.9 C) (11/08 0742) Pulse Rate:  [82-123] 83 (11/08 0742) Resp:  [15-28] 16 (11/08 0351) BP: (109-133)/(57-91) 110/57 (11/08 0742) SpO2:  [95 %-100 %] 100 % (11/08 0742) Last BM Date: 06/04/21  Intake/Output from previous day: 11/07 0701 - 11/08 0700 In: -  Out: 300 [Urine:300] Intake/Output this shift: No intake/output data recorded.  PE: General: pleasant, WD, gravid female who is laying in bed in NAD Heart: regular, rate, and rhythm.  Lungs: Respiratory effort nonlabored Abd: gravid abdomen, mild ttp along R side without peritonitis, +BS MS: all 4 extremities are symmetrical with no cyanosis, clubbing, or edema. Skin: warm and dry with no masses, lesions, or rashes Neuro: Cranial nerves 2-12 grossly intact, sensation is normal throughout Psych: A&Ox3 with an appropriate affect.    Lab Results:  Recent Labs    06/04/21 1004  WBC 10.1  HGB 12.8  HCT 39.7  PLT 334   BMET Recent Labs    06/04/21 1004  NA 131*  K 4.3  CL 100  CO2 18*  GLUCOSE 90  BUN 9  CREATININE 0.71  CALCIUM 9.4   PT/INR No results for input(s): LABPROT, INR in the last 72 hours. CMP     Component Value Date/Time   NA 131 (L) 06/04/2021 1004   K 4.3 06/04/2021 1004   CL 100 06/04/2021 1004   CO2 18 (L) 06/04/2021 1004   GLUCOSE 90 06/04/2021 1004   BUN 9 06/04/2021 1004   CREATININE 0.71 06/04/2021 1004   CALCIUM 9.4 06/04/2021 1004   PROT 7.7 06/04/2021 1004   ALBUMIN 3.5 06/04/2021 1004   AST 27 06/04/2021 1004   ALT 12 06/04/2021 1004   ALKPHOS 90 06/04/2021 1004   BILITOT 0.9 06/04/2021 1004   GFRNONAA >60 06/04/2021 1004   Lipase     Component Value Date/Time   LIPASE 39 06/04/2021 1004        Studies/Results: CT Abdomen Pelvis W Contrast  Result Date: 06/04/2021 CLINICAL DATA:  Bowel obstruction suspected. History of ulcerative colitis. EXAM: CT ABDOMEN AND PELVIS WITH CONTRAST TECHNIQUE: Multidetector CT imaging of the abdomen and pelvis was performed using the standard protocol following bolus administration of intravenous contrast. CONTRAST:  56mL OMNIPAQUE IOHEXOL 300 MG/ML  SOLN COMPARISON:  None. FINDINGS: Lower chest: Clear lung bases. Normal heart size without pericardial or pleural effusion. Hepatobiliary: Normal liver. The gallbladder is possibly identified on 20/3, surrounded by small bowel loops. No calcified stone or surrounding inflammation. No biliary duct dilatation. Pancreas: Normal, without mass or ductal dilatation. Spleen: Normal in size, without focal abnormality. Adrenals/Urinary Tract: Normal adrenal glands. Normal kidneys, without hydronephrosis. Decompressed urinary bladder. Stomach/Bowel: Normal stomach, without wall thickening. Status post colectomy and ileoanal anastomosis with J-pouch. No inflammation of the J-pouch, which is fluid-filled. Proximal small bowel is normal in caliber. The distal small bowel just proximal to the anastomosis is dilated and fluid-filled within the right-side of the abdomen, including at 6.4 cm on coronal image 58. Vascular/Lymphatic: Normal caliber of the aorta and branch vessels. No abdominopelvic adenopathy. Reproductive: Intrauterine pregnancy which is cephalic. The placenta is positioned superiorly and anteriorly. No dominant adnexal mass. Other: No abdominal ascites or free intraperitoneal air. No pelvic  fluid. Musculoskeletal: Proximal left femur fixation. IMPRESSION: 1. Status post colectomy and ileoanal anastomosis. The small bowel proximal to the anastomosis is moderately dilated, suggesting partial small bowel obstruction. No evidence of inflammation involving the J-pouch or the small bowel. 2. No other explanation for  abdominal pain. Electronically Signed   By: Abigail Miyamoto M.D.   On: 06/04/2021 15:36    Anti-infectives: Anti-infectives (From admission, onward)    None        Assessment/Plan Hx of Ulcerative colitis s/p lap colectomy with J pouch Possible PSBO - no nausea vomiting currently - ok to hold off on NGT, but if patient starts vomiting would recommend NGT placement for decompression  - still having diarrhea - consulted GI this AM - no indication for emergent surgical intervention but we will continue to follow [redacted] weeks gestation - per OB  FEN: NPO, IVF @100cc /h VTE: SCDs ID: no current abx  LOS: 1 day    Norm Parcel, [facility] 06/05/2021, 10:09 AM Please see Amion for pager number during day hours 7:00am-4:30pm

## 2021-06-06 ENCOUNTER — Encounter (HOSPITAL_COMMUNITY): Payer: Self-pay | Admitting: Gastroenterology

## 2021-06-06 DIAGNOSIS — O0990 Supervision of high risk pregnancy, unspecified, unspecified trimester: Secondary | ICD-10-CM

## 2021-06-06 DIAGNOSIS — R112 Nausea with vomiting, unspecified: Secondary | ICD-10-CM

## 2021-06-06 LAB — GASTROINTESTINAL PANEL BY PCR, STOOL (REPLACES STOOL CULTURE)

## 2021-06-06 MED ORDER — METRONIDAZOLE 500 MG PO TABS: 500.0000 mg | ORAL_TABLET | Freq: Two times a day (BID) | ORAL | 0 refills | Status: AC

## 2021-06-06 NOTE — Progress Notes (Addendum)
Progress Note  1 Day Post-Op  Subjective: Pt reports mild abdominal pain and cramping but overall feeling much better.   Objective: Vital signs in last 24 hours: Temp:  [97.4 F (36.3 C)-98.8 F (37.1 C)] 98.2 F (36.8 C) (11/09 0839) Pulse Rate:  [60-100] 90 (11/09 0839) Resp:  [13-24] 18 (11/09 0839) BP: (104-122)/(56-72) 106/60 (11/09 0839) SpO2:  [99 %-100 %] 99 % (11/09 0839) Weight:  [69.1 kg] 69.1 kg (11/09 0129) Last BM Date: 06/06/21  Intake/Output from previous day: 11/08 0701 - 11/09 0700 In: 120 [P.O.:120] Out: 600 [Urine:600] Intake/Output this shift: No intake/output data recorded.  PE: General: pleasant, WD, gravid female who is laying in bed in NAD Lungs: Respiratory effort nonlabored Abd: gravid abdomen, mild ttp along R side without peritonitis MS: all 4 extremities are symmetrical with no cyanosis, clubbing, or edema. Skin: warm and dry with no masses, lesions, or rashes Psych: A&Ox3 with an appropriate affect.    Lab Results:  Recent Labs    06/04/21 1004  WBC 10.1  HGB 12.8  HCT 39.7  PLT 334   BMET Recent Labs    06/04/21 1004  NA 131*  K 4.3  CL 100  CO2 18*  GLUCOSE 90  BUN 9  CREATININE 0.71  CALCIUM 9.4   PT/INR No results for input(s): LABPROT, INR in the last 72 hours. CMP     Component Value Date/Time   NA 131 (L) 06/04/2021 1004   K 4.3 06/04/2021 1004   CL 100 06/04/2021 1004   CO2 18 (L) 06/04/2021 1004   GLUCOSE 90 06/04/2021 1004   BUN 9 06/04/2021 1004   CREATININE 0.71 06/04/2021 1004   CALCIUM 9.4 06/04/2021 1004   PROT 7.7 06/04/2021 1004   ALBUMIN 3.5 06/04/2021 1004   AST 27 06/04/2021 1004   ALT 12 06/04/2021 1004   ALKPHOS 90 06/04/2021 1004   BILITOT 0.9 06/04/2021 1004   GFRNONAA >60 06/04/2021 1004   Lipase     Component Value Date/Time   LIPASE 39 06/04/2021 1004       Studies/Results: CT Abdomen Pelvis W Contrast  Result Date: 06/04/2021 CLINICAL DATA:  Bowel obstruction  suspected. History of ulcerative colitis. EXAM: CT ABDOMEN AND PELVIS WITH CONTRAST TECHNIQUE: Multidetector CT imaging of the abdomen and pelvis was performed using the standard protocol following bolus administration of intravenous contrast. CONTRAST:  69mL OMNIPAQUE IOHEXOL 300 MG/ML  SOLN COMPARISON:  None. FINDINGS: Lower chest: Clear lung bases. Normal heart size without pericardial or pleural effusion. Hepatobiliary: Normal liver. The gallbladder is possibly identified on 20/3, surrounded by small bowel loops. No calcified stone or surrounding inflammation. No biliary duct dilatation. Pancreas: Normal, without mass or ductal dilatation. Spleen: Normal in size, without focal abnormality. Adrenals/Urinary Tract: Normal adrenal glands. Normal kidneys, without hydronephrosis. Decompressed urinary bladder. Stomach/Bowel: Normal stomach, without wall thickening. Status post colectomy and ileoanal anastomosis with J-pouch. No inflammation of the J-pouch, which is fluid-filled. Proximal small bowel is normal in caliber. The distal small bowel just proximal to the anastomosis is dilated and fluid-filled within the right-side of the abdomen, including at 6.4 cm on coronal image 58. Vascular/Lymphatic: Normal caliber of the aorta and branch vessels. No abdominopelvic adenopathy. Reproductive: Intrauterine pregnancy which is cephalic. The placenta is positioned superiorly and anteriorly. No dominant adnexal mass. Other: No abdominal ascites or free intraperitoneal air. No pelvic fluid. Musculoskeletal: Proximal left femur fixation. IMPRESSION: 1. Status post colectomy and ileoanal anastomosis. The small bowel proximal to the anastomosis  is moderately dilated, suggesting partial small bowel obstruction. No evidence of inflammation involving the J-pouch or the small bowel. 2. No other explanation for abdominal pain. Electronically Signed   By: Abigail Miyamoto M.D.   On: 06/04/2021 15:36   [place] MFM OB COMP + 14 WK  Result  Date: 06/06/2021 ----------------------------------------------------------------------  OBSTETRICS REPORT                        (Signed Final 06/06/2021 05:39 am) ---------------------------------------------------------------------- Patient Info  ID #:       [id number]                          D.O.B.:  10-09-1990 (30 yrs)  Name:       Brooke Mckee                   Visit Date: 06/05/2021 11:19 am ---------------------------------------------------------------------- Performed By  Attending:        Sander Nephew      Ref. Address:      [place], [place]  Performed By:     Germain Osgood            [facility]                    [place]  Referred By:      Aletha Halim MD ---------------------------------------------------------------------- Orders  #  Description                           Code        Ordered By  1  [place] MFM OB COMP + 14 WK                76805.01    CHARLIE PICKENS ----------------------------------------------------------------------  #  Order #  Accession #                Episode #  1  [id number]                   [id number]                 [id number] ---------------------------------------------------------------------- Indications  [redacted] weeks gestation of pregnancy                 Z3A.33  History of cesarean delivery, currently         O34.219  pregnant  Abdominal pain in pregnancy                     V37.10  Medical complication of pregnancy               O26.90  (Ulcerative cholitis with colectomy) ---------------------------------------------------------------------- Fetal Evaluation  Num Of Fetuses:          1  Fetal Heart Rate(bpm):   148  Cardiac Activity:         Observed  Presentation:            Cephalic  Placenta:                Anterior  P. Cord Insertion:       Visualized, central  Amniotic Fluid  AFI FV:      Within normal limits  AFI Sum(cm)     %Tile       Largest Pocket(cm)  11              26          3.9  RUQ(cm)       RLQ(cm)       LUQ(cm)        LLQ(cm)  3.9           1.7           3.2            2.2 ---------------------------------------------------------------------- Biometry  BPD:      83.9  mm     G. Age:  33w 5d         60  %    CI:        70.98   %    70 - 86                                                          FL/HC:       18.5  %    19.9 - 21.5  HC:      317.3  mm     G. Age:  35w 5d         75  %    HC/AC:       1.10       0.96 - 1.11  AC:      289.3  mm     G. Age:  33w 0d         41  %    FL/BPD:      70.1  %    71 - 87  FL:       58.8  mm     G. Age:  30w 5d        1.4  %  FL/AC:       20.3  %    20 - 24  HUM:      51.4  mm     G. Age:  30w 0d        < 5  %  CER:      45.2  mm     G. Age:  34w 6d         65  %  LV:        3.1  mm  CM:        7.5  mm  Est. FW:    2013   gm     4 lb 7 oz     23  % ---------------------------------------------------------------------- OB History  Gravidity:    2  Living:       1 ---------------------------------------------------------------------- Gestational Age  Clinical EDD:  33w 2d                                        EDD:   07/22/21  U/S Today:     33w 2d                                        EDD:   07/22/21  Best:          33w 2d     Det. By:  Clinical EDD             EDD:   07/22/21 ---------------------------------------------------------------------- Anatomy  Cranium:               Appears normal         LVOT:                   Appears normal  Cavum:                 Appears normal         Aortic Arch:            Appears normal  Ventricles:            Appears normal         Ductal Arch:            Appears normal  Choroid Plexus:        Appears normal         Diaphragm:              Appears normal   Cerebellum:            Appears normal         Stomach:                Appears normal, left                                                                        sided  Posterior Fossa:       Appears normal         Abdomen:  Appears normal  Nuchal Fold:           Not applicable (>27    Abdominal Wall:         Not well visualized                         wks GA)  Face:                  Orbits nl; profile     Cord Vessels:           Appears normal (3                         limited                                        vessel cord)  Lips:                  Appears normal         Kidneys:                Appear normal  Palate:                Not well visualized    Bladder:                Appears normal  Thoracic:              Appears normal         Spine:                  Not well visualized  Heart:                 Appears normal         Upper Extremities:      Not well visualized                         (4CH, axis, and                         situs)  RVOT:                  Appears normal         Lower Extremities:      Not well visualized  Other:  Technically difficult due to advanced gestational age. ---------------------------------------------------------------------- Cervix Uterus Adnexa  Cervix  Not visualized (advanced GA >24wks)  Uterus  No abnormality visualized.  Right Ovary  Not visualized.  Left Ovary  Not visualized.  Cul De Sac  No free fluid seen.  Adnexa  No adnexal mass visualized. ---------------------------------------------------------------------- Impression  Ms. Godbey is a G2P1 who is hospitalized at 33w 2 d for  suspected flare of ulcerative colitis.  A single intrauterine pregnancy here for a detailed anatomy  Normal anatomy with measurements consistent with dates  There is good fetal movement and amniotic fluid volume  Suboptimal views of the fetal anatomy were obtained  secondary to fetal position and advanced gestational age.  ---------------------------------------------------------------------- Recommendations  Continue serial growth given UC, as UC can be associated  with fetal growth delays. ----------------------------------------------------------------------               Sander Nephew, MD Electronically Signed Final Report   06/06/2021 05:39 am ----------------------------------------------------------------------  Anti-infectives: Anti-infectives (From admission, onward)    Start     Dose/Rate Route Frequency Ordered Stop   06/05/21 2130  metroNIDAZOLE (FLAGYL) IVPB 500 mg        500 mg 100 mL/hr over 60 Minutes Intravenous Every 12 hours 06/05/21 2032 06/19/21 2129   06/05/21 2115  metroNIDAZOLE (FLAGYL) IVPB 500 mg  Status:  Discontinued        500 mg 100 mL/hr over 60 Minutes Intravenous Every 12 hours 06/05/21 2028 06/05/21 2030   06/05/21 1715  metroNIDAZOLE (FLAGYL) tablet 500 mg  Status:  Discontinued        500 mg Oral Every 12 hours 06/05/21 1648 06/05/21 2030        Assessment/Plan Hx of 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

## 2021-06-06 NOTE — Discharge Summary (Signed)
Physician Discharge Summary  Patient ID: Brooke Mckee MRN: [id number] DOB/AGE: May 07, 1991 30 y.o.  Admit date: 06/04/2021 Discharge date: 06/06/2021  Admission Diagnoses: Pregnancy at 33/1. Abdominal pain, diarrhea, vomiting. Concern for partial SBO on CT  Discharge Diagnoses: pregnancy at 33/3. Improving pouchitis  Discharged Condition: good  Hospital Course:  *OB: no OB concerns with reactive non stress tests during hospitalization. She had a surveillance growth u/s that showed 23%, 2013gm, ac 72%, afi 11, cephalic. *GI: seen by Gen Surg and GI. She underwent flex sig on HD#2 and dx with pouchitis and flagyl started. Patient improved and was discharged on HD#3 with plans for outpatient GI follow up  Consults:  General surgery, GI  Significant Diagnostic Studies: pending GI panel, calprotectin (fecal), flex sig biopsies  Discharge Exam: Blood pressure 106/60, pulse 90, temperature 98.2 F (36.8 C), temperature source Oral, resp. rate 18, height 5\' 7"  (1.702 m), weight 69.1 kg, SpO2 99 %. NAD No respiratory distress Gravid, nttp, nd Skin: warm and dry Ext: no c/c/e  Disposition: Discharge disposition: 01-Home or Self Care       Discharge Instructions     Discharge patient   Complete by: As directed    Discharge disposition: 01-Home or Self Care   Discharge patient date: 06/06/2021      Allergies as of 06/06/2021       Reactions   Morphine Hives   All over body        Medication List     STOP taking these medications    ciprofloxacin 500 MG tablet Commonly known as: CIPRO       TAKE these medications    albuterol 108 (90 Base) MCG/ACT inhaler Commonly known as: VENTOLIN HFA Inhale 1-2 puffs into the lungs every 6 (six) hours as needed for wheezing or shortness of breath.   ferrous sulfate 325 (65 FE) MG tablet Take 325 mg by mouth daily with breakfast.   metroNIDAZOLE 500 MG tablet Commonly known as: FLAGYL Take 1 tablet (500 mg total) by mouth  2 (two) times daily for 14 days.   ondansetron 8 MG disintegrating tablet Commonly known as: ZOFRAN-ODT Take 8 mg by mouth every 8 (eight) hours as needed for nausea/vomiting.   PRENATAL VITAMINS PO Take 1 tablet by mouth daily.        Follow-up Information     Cirigliano, Vito V, DO. Call in 5 day(s).   Specialty: Gastroenterology Why: Call his office early next week if you have not heard about a follow up appointment in 2-3 weeks Contact information: [address] [place] [address] [facility] .   Specialty: Gastroenterology Contact information: [place] [address] [place] [facility] [place] [facility]. Go in 13 day(s).   Specialty: Obstetrics and Gynecology Contact information: [place] [address] [phone]                Signed: Aletha Halim 06/06/2021, 1:15 PM

## 2021-06-06 NOTE — Plan of Care (Signed)
Patient to be discharged home with printed instructions. Hank Walling L Carmeron Heady, RN  

## 2021-06-06 NOTE — Progress Notes (Signed)
[facility] ROUNDING NOTE   Subjective: No acute events overnight.  Flexible sigmoidoscopy/pouchoscopy yesterday with mild pouchitis.  Started on Flagyl.  Today, she reports significant clinical improvement.  Tolerating p.o. intake without issue.  Decreased bowel frequency and improved stool formed.  No hematochezia.  Labs notable for CRP 9.9.  Fecal calprotectin and GI PCR panel pending.   Objective: Vital signs in last 24 hours: Temp:  [97.4 F (36.3 C)-98.8 F (37.1 C)] 98.2 F (36.8 C) (11/09 0839) Pulse Rate:  [60-100] 90 (11/09 0839) Resp:  [13-24] 18 (11/09 0839) BP: (104-122)/(59-72) 106/60 (11/09 0839) SpO2:  [99 %-100 %] 99 % (11/09 0839) Weight:  [69.1 kg] 69.1 kg (11/09 0129) Last BM Date: 06/06/21 General: NAD Abdomen: Gravid, otherwise soft Ext:  No c/c/e    Intake/Output from previous day: 11/08 0701 - 11/09 0700 In: 120 [P.O.:120] Out: 600 [Urine:600] Intake/Output this shift: No intake/output data recorded.   Lab Results: Recent Labs    06/04/21 1004  WBC 10.1  HGB 12.8  PLT 334  MCV 85.4   BMET Recent Labs    06/04/21 1004  NA 131*  K 4.3  CL 100  CO2 18*  GLUCOSE 90  BUN 9  CREATININE 0.71  CALCIUM 9.4   LFT Recent Labs    06/04/21 1004  PROT 7.7  ALBUMIN 3.5  AST 27  ALT 12  ALKPHOS 90  BILITOT 0.9   PT/INR No results for input(s): INR in the last 72 hours.    Imaging/Other results: CT Abdomen Pelvis W Contrast  Result Date: 06/04/2021 CLINICAL DATA:  Bowel obstruction suspected. History of ulcerative colitis. EXAM: CT ABDOMEN AND PELVIS WITH CONTRAST TECHNIQUE: Multidetector CT imaging of the abdomen and pelvis was performed using the standard protocol following bolus administration of intravenous contrast. CONTRAST:  34mL OMNIPAQUE IOHEXOL 300 MG/ML  SOLN COMPARISON:  None. FINDINGS: Lower chest: Clear lung bases. Normal heart size without pericardial or pleural effusion. Hepatobiliary: Normal liver. The  gallbladder is possibly identified on 20/3, surrounded by small bowel loops. No calcified stone or surrounding inflammation. No biliary duct dilatation. Pancreas: Normal, without mass or ductal dilatation. Spleen: Normal in size, without focal abnormality. Adrenals/Urinary Tract: Normal adrenal glands. Normal kidneys, without hydronephrosis. Decompressed urinary bladder. Stomach/Bowel: Normal stomach, without wall thickening. Status post colectomy and ileoanal anastomosis with J-pouch. No inflammation of the J-pouch, which is fluid-filled. Proximal small bowel is normal in caliber. The distal small bowel just proximal to the anastomosis is dilated and fluid-filled within the right-side of the abdomen, including at 6.4 cm on coronal image 58. Vascular/Lymphatic: Normal caliber of the aorta and branch vessels. No abdominopelvic adenopathy. Reproductive: Intrauterine pregnancy which is cephalic. The placenta is positioned superiorly and anteriorly. No dominant adnexal mass. Other: No abdominal ascites or free intraperitoneal air. No pelvic fluid. Musculoskeletal: Proximal left femur fixation. IMPRESSION: 1. Status post colectomy and ileoanal anastomosis. The small bowel proximal to the anastomosis is moderately dilated, suggesting partial small bowel obstruction. No evidence of inflammation involving the J-pouch or the small bowel. 2. No other explanation for abdominal pain. Electronically Signed   By: Abigail Miyamoto M.D.   On: 06/04/2021 15:36   [place] MFM OB COMP + 14 WK  Result Date: 06/06/2021 ----------------------------------------------------------------------  OBSTETRICS REPORT                        (Signed Final 06/06/2021 05:39 am) ---------------------------------------------------------------------- Patient Info  ID #:       [id number]  D.O.B.:  Aug 26, 1990 (30 yrs)  Name:       Brooke Mckee                   Visit Date: 06/05/2021 11:19 am  ---------------------------------------------------------------------- Performed By  Attending:        Sander Nephew      Ref. Address:      [place], [place]  Performed By:     Germain Osgood            [facility]                    [place]  Referred By:      Aletha Halim MD ---------------------------------------------------------------------- Orders  #  Description                           Code        Ordered By  1  [place] MFM OB COMP + 14 WK                00762.26    CHARLIE PICKENS ----------------------------------------------------------------------  #  Order #                     Accession #                Episode #  1  [id number]                   [id number]                 [id number] ---------------------------------------------------------------------- Indications  [redacted] weeks gestation of pregnancy                 Z3A.33  History of cesarean delivery, currently         O52.219  pregnant  Abdominal pain in  pregnancy                     L57.26  Medical complication of pregnancy               O26.90  (Ulcerative cholitis with colectomy) ---------------------------------------------------------------------- Fetal Evaluation  Num Of Fetuses:          1  Fetal Heart Rate(bpm):   148  Cardiac Activity:        Observed  Presentation:            Cephalic  Placenta:                Anterior  P. Cord Insertion:       Visualized, central  Amniotic Fluid  AFI FV:      Within normal limits  AFI Sum(cm)     %Tile       Largest Pocket(cm)  11              26          3.9  RUQ(cm)       RLQ(cm)       LUQ(cm)        LLQ(cm)  3.9           1.7           3.2            2.2  ---------------------------------------------------------------------- Biometry  BPD:      83.9  mm     G. Age:  33w 5d         60  %    CI:        70.98   %    70 - 86                                                          FL/HC:       18.5  %    19.9 - 21.5  HC:      317.3  mm     G. Age:  35w 5d         75  %    HC/AC:       1.10       0.96 - 1.11  AC:      289.3  mm     G. Age:  33w 0d         41  %    FL/BPD:      70.1  %    71 - 87  FL:       58.8  mm     G. Age:  30w 5d        1.4  %    FL/AC:       20.3  %    20 - 24  HUM:      51.4  mm     G. Age:  30w 0d        < 5  %  CER:      45.2  mm     G. Age:  34w 6d         65  %  LV:        3.1  mm  CM:        7.5  mm  Est. FW:    2013   gm     4 lb 7 oz     23  % ---------------------------------------------------------------------- OB History  Gravidity:    2  Living:       1 ---------------------------------------------------------------------- Gestational Age  Clinical EDD:  33w 2d                                        EDD:   07/22/21  U/S Today:     33w 2d                                        EDD:   07/22/21  Best:          33w 2d     Det. By:  Clinical EDD             EDD:   07/22/21 ---------------------------------------------------------------------- Anatomy  Cranium:               Appears normal         LVOT:                   Appears normal  Cavum:                 Appears normal         Aortic Arch:            Appears normal  Ventricles:            Appears normal         Ductal Arch:            Appears normal  Choroid Plexus:        Appears normal         Diaphragm:              Appears normal  Cerebellum:            Appears normal         Stomach:                Appears normal, left                                                                        sided  Posterior Fossa:       Appears normal         Abdomen:                Appears normal  Nuchal Fold:           Not applicable (>58    Abdominal Wall:         Not well visualized                          wks GA)  Face:                  Orbits nl; profile     Cord Vessels:           Appears normal (  3                         limited                                        vessel cord)  Lips:                  Appears normal         Kidneys:                Appear normal  Palate:                Not well visualized    Bladder:                Appears normal  Thoracic:              Appears normal         Spine:                  Not well visualized  Heart:                 Appears normal         Upper Extremities:      Not well visualized                         (4CH, axis, and                         situs)  RVOT:                  Appears normal         Lower Extremities:      Not well visualized  Other:  Technically difficult due to advanced gestational age. ---------------------------------------------------------------------- Cervix Uterus Adnexa  Cervix  Not visualized (advanced GA >24wks)  Uterus  No abnormality visualized.  Right Ovary  Not visualized.  Left Ovary  Not visualized.  Cul De Sac  No free fluid seen.  Adnexa  No adnexal mass visualized. ---------------------------------------------------------------------- Impression  Ms. Doland is a G2P1 who is hospitalized at 33w 2 d for  suspected flare of ulcerative colitis.  A single intrauterine pregnancy here for a detailed anatomy  Normal anatomy with measurements consistent with dates  There is good fetal movement and amniotic fluid volume  Suboptimal views of the fetal anatomy were obtained  secondary to fetal position and advanced gestational age. ---------------------------------------------------------------------- Recommendations  Continue serial growth given UC, as UC can be associated  with fetal growth delays. ----------------------------------------------------------------------               Sander Nephew, MD Electronically Signed Final Report   06/06/2021 05:39 am ----------------------------------------------------------------------      Assessment and Plan:  1) Pouchitis 2) History of UC s/p total colectomy with ileal anal anastomosis/J-pouch 3) Diarrhea-improving 4) Nausea-resolved  - Significant clinical improvement over the last 24 hours since starting Flagyl.  Decreased stool frequency and improved stool formed.  Tolerating p.o. intake without issue. - Flexible sigmoidoscopy 06/05/2021 with mild pouchitis.  Pathology pending. - GI PCR panel and fecal calprotectin drawn and pending.  Does not need to wait for these results prior to discharge - Elevated CRP c/w active inflammation.  - Okay for discharge home today from a  GI standpoint.  Plan for Flagyl 500 mg BID x14 days total - We will arrange for follow-up with me in the GI clinic in 2-3 weeks  5) [redacted] weeks gestation - Management per primary OB service   Lavena Bullion, DO  06/06/2021, 12:51 PM [facility] Pager [phone]

## 2021-06-06 NOTE — Progress Notes (Signed)
Daily Antepartum Note  Admission Date: 06/04/2021 Current Date: 06/06/2021 12:42 PM  Brooke Mckee is a 30 y.o. G2P1001 @ [redacted]w[redacted]d, admitted for concern for pSBO; pt diagnosed yesterday on flex sig with pouchitis.  Pregnancy complicated by: Patient Active Problem List   Diagnosis Date Noted   Pouchitis ([place]) 06/05/2021   Diarrhea of presumed infectious origin    Fibrocystic disease of breast 06/04/2021   Partial small bowel obstruction ([place]) 06/04/2021   Supervision of high risk pregnancy, antepartum 04/17/2021   History of C-section 40/04/2724   Anemia complicating pregnancy, unspecified trimester 03/22/2021   Proteinuria affecting pregnancy in first trimester 01/16/2021   Encounter for supervision of normal pregnancy in multigravida 01/03/2021   S/P primary low transverse C-section 12/29/2018   Hypokalemia 06/19/2018   Migraine without status migrainosus, not intractable 09/12/2017   Ulcerative colitis with complication ([place]) 36/64/4034   Annual physical exam 01/28/2017   Osteoporosis without current pathological fracture 01/28/2017    Overnight/24hr events:  none  Subjective:  Improving GI s/s. No OB s/s.   Objective:    Current Vital Signs 24h Vital Sign Ranges  T 98.2 F (36.8 C) Temp  Avg: 98.2 F (36.8 C)  Min: 97.4 F (36.3 C)  Max: 98.8 F (37.1 C)  BP 106/60 BP  Min: 104/62  Max: 122/64  HR 90 Pulse  Avg: 75.9  Min: 60  Max: 100  RR 18 Resp  Avg: 17.6  Min: 13  Max: 24  SaO2 99 %  (room air) SpO2  Avg: 99.9 %  Min: 99 %  Max: 100 %       24 Hour I/O Current Shift I/O  Time Ins Outs 11/08 0701 - 11/09 0700 In: 120 [P.O.:120] Out: 600 [Urine:600] No intake/output data recorded.   Fetal Heart Tones: 135 baseline, +accels, no decel, mod variability Tocometry: quiet  Physical exam: General: Well nourished, well developed female in no acute distress. Abdomen: gravid nttp Respiratory: no respiratory distress Extremities: no clubbing, cyanosis or edema Skin:  Warm and dry.   Medications: Current Facility-Administered Medications  Medication Dose Route Frequency Provider Last Rate Last Admin   acetaminophen (TYLENOL) tablet 650 mg  650 mg Oral Q4H PRN Cirigliano, Vito V, DO       dextrose 5 % in lactated ringers infusion   Intravenous Continuous Cirigliano, Vito V, DO 100 mL/hr at 06/05/21 0636 New Bag at 06/05/21 0636   liver oil-zinc oxide (DESITIN) 40 % ointment   Topical BID Milus Banister, MD   Given at 06/05/21 2053   metroNIDAZOLE (FLAGYL) IVPB 500 mg  500 mg Intravenous Q12H Milus Banister, MD 100 mL/hr at 06/06/21 1030 500 mg at 06/06/21 1030   ondansetron (ZOFRAN) injection 4 mg  4 mg Intravenous Q6H PRN Cirigliano, Vito V, DO   4 mg at 06/06/21 0810   sodium phosphate (FLEET) 7-19 GM/118ML enema 1 enema  1 enema Rectal Once Cirigliano, Vito V, DO        Labs:  Recent Labs  Lab 06/04/21 1004  WBC 10.1  HGB 12.8  HCT 39.7  PLT 334    Recent Labs  Lab 06/04/21 1004  NA 131*  K 4.3  CL 100  CO2 18*  BUN 9  CREATININE 0.71  CALCIUM 9.4  PROT 7.7  BILITOT 0.9  ALKPHOS 90  ALT 12  AST 27  GLUCOSE 90    Radiology:  11/8: 23%, 2013gm, ac 41%, afi 11  Assessment & Plan:  Pt improving *Pregnancy:reactive NST.  Ask GI in regards to d/c planning and follow up. Doing well and no concerns from OB standpoint  Durene Romans MD Attending Center for [facility]) GYN Consult Phone: [phone] (M-F, [phone]) & [phone]  (Off hours, weekends, holidays)

## 2021-06-06 NOTE — Consult Note (Signed)
   [facility] CM Inpatient Consult   06/06/2021  Brooke Mckee 19-Mar-1991 [id number]   [place] [facility] [ACO] Patient: [name] plan   Primary Care Provider:  Pcp, No   Patient listed on [place] list today.       Plan: Will request patient to be followed by [place] Coordinator for post hospital community support and follow up.   For additional questions or referrals please contact:   Natividad Brood, RN BSN [facility] Liaison  [phone] business mobile phone Toll free office [phone]  Fax number: [phone] [email or web address]

## 2021-06-07 ENCOUNTER — Encounter: Payer: Self-pay | Admitting: *Deleted

## 2021-06-07 ENCOUNTER — Other Ambulatory Visit: Payer: Self-pay | Admitting: *Deleted

## 2021-06-07 LAB — SURGICAL PATHOLOGY

## 2021-06-07 LAB — CALPROTECTIN, FECAL: Calprotectin, Fecal: 603 ug/g — ABNORMAL HIGH (ref 0–120)

## 2021-06-07 NOTE — Patient Outreach (Signed)
Brooke Mckee) Care Management  06/07/2021  Brooke Mckee Nov 04, 1990 [id number]   Transition of care call/case closure   Referral received:06/06/21 Initial outreach:06/07/21 Insurance: [place] [employer]    Subjective: Initial successful telephone call to patient's preferred number in order to complete transition of care assessment; 2 HIPAA identifiers verified. Explained purpose of call and completed transition of care assessment.  Brooke Mckee states that she is feeling well.denies She reports tolerating bland diet, she reports diarrhea is getting better, she states resting and taking it easy. Spouse is  assisting with her recovery.   Objective:  Brooke Mckee  was hospitalized at [facility]/7[facility]11/10 for  Abdominal pain, diarrhea, concern for SBO,  Flexible sigmoidscopy - mild pouchitis .PMHx: Pregnancy 33 weeks, ulcerative colitis,  She  was discharged to home on 06/06/21 without the need for home health services or DME.   Assessment:  Patient voices good understanding of all discharge instructions.  See transition of care flowsheet for assessment details.   Plan:  Reviewed hospital discharge diagnosis of Abdominal pain, diarrhea, pouchitis   and discharge treatment plan using hospital discharge instructions, assessing medication adherence, reviewing problems requiring provider notification, and discussing the importance of follow up with surgeon, primary care provider and/or specialists as directed.   No ongoing care management needs identified so will close case to [place] Management services and route successful outreach letter with [facility] Management pamphlet and 24 Hour Nurse Line Magnet to [place] Management clinical pool to be mailed to patient's home address.   Joylene Draft, RN, BSN  [place] Management Coordinator  [phone]- Mobile [phone]- Toll Free Main  Office  .

## 2021-06-11 ENCOUNTER — Telehealth: Payer: Self-pay | Admitting: General Surgery

## 2021-06-11 NOTE — Telephone Encounter (Signed)
Spoke with the patient and we double booked her on 07/05/2021 per Dr Bryan Lemma

## 2021-06-11 NOTE — Telephone Encounter (Signed)
-----   Message from [place], DO sent at 06/06/2021  7:53 AM EST ----- I expect this patient will d/c in the next day or 2. Please arrange for f/u with me in 2-3 weeks. Ok to [place]. Hx of UC admitted w/ pouchitis. Thanks!

## 2021-06-19 ENCOUNTER — Ambulatory Visit (INDEPENDENT_AMBULATORY_CARE_PROVIDER_SITE_OTHER): Payer: 59 | Admitting: Obstetrics and Gynecology

## 2021-06-19 ENCOUNTER — Other Ambulatory Visit: Payer: Self-pay

## 2021-06-19 ENCOUNTER — Encounter: Payer: Self-pay | Admitting: Obstetrics and Gynecology

## 2021-06-19 VITALS — BP 114/68 | HR 81 | Wt 155.0 lb

## 2021-06-19 DIAGNOSIS — Z98891 History of uterine scar from previous surgery: Secondary | ICD-10-CM

## 2021-06-19 DIAGNOSIS — O99019 Anemia complicating pregnancy, unspecified trimester: Secondary | ICD-10-CM

## 2021-06-19 DIAGNOSIS — K51819 Other ulcerative colitis with unspecified complications: Secondary | ICD-10-CM

## 2021-06-19 DIAGNOSIS — O0990 Supervision of high risk pregnancy, unspecified, unspecified trimester: Secondary | ICD-10-CM

## 2021-06-19 NOTE — Progress Notes (Signed)
   PRENATAL VISIT NOTE  Subjective:  Brooke Mckee is a 30 y.o. G2P1001 at [redacted]w[redacted]d being seen today for ongoing prenatal care.  She is currently monitored for the following issues for this high-risk pregnancy and has Anemia complicating pregnancy, unspecified trimester; Supervision of high risk pregnancy, antepartum; History of C-section; Ulcerative colitis with complication ([place]); S/P primary low transverse C-section; Annual physical exam; Fibrocystic disease of breast; Hypokalemia; Migraine without status migrainosus, not intractable; Osteoporosis without current pathological fracture; Proteinuria affecting pregnancy in first trimester; Encounter for supervision of normal pregnancy in multigravida; Partial small bowel obstruction ([place]); Pouchitis ([place]); and Diarrhea of presumed infectious origin on their problem list.  Patient reports no complaints.  Contractions: Irritability. Vag. Bleeding: None.  Movement: Present. Denies leaking of fluid.   The following portions of the patient's history were reviewed and updated as appropriate: allergies, current medications, past family history, past medical history, past social history, past surgical history and problem list.   Objective:   Vitals:   06/19/21 0823  BP: 114/68  Pulse: 81  Weight: 155 lb (70.3 kg)    Fetal Status: Fetal Heart Rate (bpm): 142 Fundal Height: 34 cm Movement: Present     General:  Alert, oriented and cooperative. Patient is in no acute distress.  Skin: Skin is warm and dry. No rash noted.   Cardiovascular: Normal heart rate noted  Respiratory: Normal respiratory effort, no problems with respiration noted  Abdomen: Soft, gravid, appropriate for gestational age.  Pain/Pressure: Present     Pelvic: Cervical exam deferred        Extremities: Normal range of motion.  Edema: Trace  Mental Status: Normal mood and affect. Normal behavior. Normal judgment and thought content.   Assessment and Plan:  Pregnancy: G2P1001 at  [redacted]w[redacted]d 1. Supervision of high risk pregnancy, antepartum Patient is doing well Cultures next visit  2. History of C-section Scheduled for repeat c-section on 12/20  3. Anemia complicating pregnancy, unspecified trimester   4. Other ulcerative colitis with complication ([place]) Stable  Preterm labor symptoms and general obstetric precautions including but not limited to vaginal bleeding, contractions, leaking of fluid and fetal movement were reviewed in detail with the patient. Please refer to After Visit Summary for other counseling recommendations.   Return in about 1 week (around 06/26/2021) for in person, ROB, High risk.  Future Appointments  Date Time Provider [place]  07/05/2021 10:00 AM Cirigliano, Dominic Pea, DO [facility]    Mora Bellman, MD

## 2021-07-02 ENCOUNTER — Encounter: Payer: Self-pay | Admitting: Obstetrics and Gynecology

## 2021-07-02 ENCOUNTER — Ambulatory Visit (INDEPENDENT_AMBULATORY_CARE_PROVIDER_SITE_OTHER): Payer: 59 | Admitting: Obstetrics and Gynecology

## 2021-07-02 ENCOUNTER — Other Ambulatory Visit (HOSPITAL_BASED_OUTPATIENT_CLINIC_OR_DEPARTMENT_OTHER): Payer: Self-pay

## 2021-07-02 ENCOUNTER — Other Ambulatory Visit: Payer: Self-pay

## 2021-07-02 ENCOUNTER — Other Ambulatory Visit (HOSPITAL_COMMUNITY)
Admission: RE | Admit: 2021-07-02 | Discharge: 2021-07-02 | Disposition: A | Discharge status: Home / Self Care | Payer: 59 | Source: Ambulatory Visit | Attending: Obstetrics and Gynecology | Admitting: Obstetrics and Gynecology

## 2021-07-02 VITALS — BP 121/86 | HR 76 | Wt 155.3 lb

## 2021-07-02 DIAGNOSIS — O0990 Supervision of high risk pregnancy, unspecified, unspecified trimester: Secondary | ICD-10-CM

## 2021-07-02 DIAGNOSIS — Z98891 History of uterine scar from previous surgery: Secondary | ICD-10-CM

## 2021-07-02 MED ORDER — SERTRALINE HCL 25 MG PO TABS
25.0000 mg | ORAL_TABLET | Freq: Every day | ORAL | 5 refills | Status: DC
  Filled 2021-07-02: qty 30, 30d supply, fill #0
  Filled 2021-08-15: qty 30, 30d supply, fill #1

## 2021-07-02 NOTE — Progress Notes (Signed)
Subjective:  Brooke Mckee is a 30 y.o. G2P1001 at [redacted]w[redacted]d being seen today for ongoing prenatal care.  She is currently monitored for the following issues for this low-risk pregnancy and has Anemia complicating pregnancy, unspecified trimester; Supervision of high risk pregnancy, antepartum; History of C-section; Ulcerative colitis with complication ([place]); S/P primary low transverse C-section; Annual physical exam; Fibrocystic disease of breast; Migraine without status migrainosus, not intractable; Osteoporosis without current pathological fracture; Proteinuria affecting pregnancy in first trimester; Encounter for supervision of normal pregnancy in multigravida; Partial small bowel obstruction ([place]); Pouchitis ([place]); and Diarrhea of presumed infectious origin on their problem list.  Patient reports general discomforts of pregnancy.  Contractions: Irritability. Vag. Bleeding: None.  Movement: Present. Denies leaking of fluid.   The following portions of the patient's history were reviewed and updated as appropriate: allergies, current medications, past family history, past medical history, past social history, past surgical history and problem list. Problem list updated.  Objective:   Vitals:   07/02/21 0826  BP: 121/86  Pulse: 76  Weight: 155 lb 4.8 oz (70.4 kg)    Fetal Status: Fetal Heart Rate (bpm): 139   Movement: Present     General:  Alert, oriented and cooperative. Patient is in no acute distress.  Skin: Skin is warm and dry. No rash noted.   Cardiovascular: Normal heart rate noted  Respiratory: Normal respiratory effort, no problems with respiration noted  Abdomen: Soft, gravid, appropriate for gestational age. Pain/Pressure: Present     Pelvic:  Cervical exam performed        Extremities: Normal range of motion.  Edema: Trace  Mental Status: Normal mood and affect. Normal behavior. Normal judgment and thought content.   Urinalysis:      Assessment and Plan:  Pregnancy: G2P1001  at [redacted]w[redacted]d  1. Supervision of high risk pregnancy, antepartum Labor precautions - GC/Chlamydia probe amp ([place])not at [facility] - Culture, beta strep (group b only)  2. History of C-section For repeat 07/17/21  Pt reports H/O PP depression with last pregnancy. No medication. Discussed [place] availability here, as well as starting antidepressant medication now. R/B on medication reviewed. Pt desires to start. Will start Zoloft.   Term labor symptoms and general obstetric precautions including but not limited to vaginal bleeding, contractions, leaking of fluid and fetal movement were reviewed in detail with the patient. Please refer to After Visit Summary for other counseling recommendations.  Return in about 1 week (around 07/09/2021) for OB visit, face to face, any provider.   Chancy Milroy, MD

## 2021-07-02 NOTE — Patient Instructions (Signed)

## 2021-07-03 LAB — GC/CHLAMYDIA PROBE AMP (~~LOC~~) NOT AT ARMC
Chlamydia: NEGATIVE
Comment: NEGATIVE
Comment: NORMAL
Neisseria Gonorrhea: NEGATIVE

## 2021-07-05 ENCOUNTER — Ambulatory Visit: Payer: 59 | Admitting: Gastroenterology

## 2021-07-06 LAB — CULTURE, BETA STREP (GROUP B ONLY): Strep Gp B Culture: NEGATIVE

## 2021-07-10 ENCOUNTER — Telehealth: Payer: Self-pay | Admitting: General Practice

## 2021-07-10 NOTE — Telephone Encounter (Signed)
Patient called into front office stating she is scheduled for a csection on 12/20 and started having contractions this morning that occur occasionally. She is unsure if she should go to the hospital or not. Patient states she noticed yesterday she lost her mucous plug and the baby dropped in her belly. Patient was told she cannot go into labor and that's why she is scheduled for a c-section. Discussed with patient we generally recommend going to the hospital if contractions are 4-5 minutes apart for longer than an hour. Recommended hydrating well today and drinking at least two bottles of water over the next hour. Discussed if she is having contractions that are painful and/or difficult to talk through she should also go to the hospital for evaluation. Patient verbalized understanding.

## 2021-07-11 ENCOUNTER — Encounter (HOSPITAL_COMMUNITY)
Admission: AD | Disposition: A | Discharge status: Home / Self Care | Payer: Self-pay | Source: Home / Self Care | Attending: Obstetrics & Gynecology

## 2021-07-11 ENCOUNTER — Inpatient Hospital Stay (HOSPITAL_COMMUNITY): Payer: 59 | Admitting: Anesthesiology

## 2021-07-11 ENCOUNTER — Other Ambulatory Visit: Payer: Self-pay

## 2021-07-11 ENCOUNTER — Inpatient Hospital Stay (HOSPITAL_COMMUNITY)
Admission: AD | Admit: 2021-07-11 | Discharge: 2021-07-13 | DRG: 788 | Disposition: A | Discharge status: Home / Self Care | Payer: 59 | Attending: Obstetrics & Gynecology | Admitting: Obstetrics & Gynecology

## 2021-07-11 ENCOUNTER — Inpatient Hospital Stay (HOSPITAL_BASED_OUTPATIENT_CLINIC_OR_DEPARTMENT_OTHER): Payer: 59

## 2021-07-11 ENCOUNTER — Encounter (HOSPITAL_COMMUNITY): Payer: Self-pay | Admitting: Obstetrics & Gynecology

## 2021-07-11 DIAGNOSIS — O9902 Anemia complicating childbirth: Secondary | ICD-10-CM | POA: Diagnosis not present

## 2021-07-11 DIAGNOSIS — O0990 Supervision of high risk pregnancy, unspecified, unspecified trimester: Secondary | ICD-10-CM

## 2021-07-11 DIAGNOSIS — O403XX0 Polyhydramnios, third trimester, not applicable or unspecified: Principal | ICD-10-CM | POA: Diagnosis present

## 2021-07-11 DIAGNOSIS — K51919 Ulcerative colitis, unspecified with unspecified complications: Secondary | ICD-10-CM | POA: Diagnosis present

## 2021-07-11 DIAGNOSIS — Z3A38 38 weeks gestation of pregnancy: Secondary | ICD-10-CM | POA: Diagnosis not present

## 2021-07-11 DIAGNOSIS — O34219 Maternal care for unspecified type scar from previous cesarean delivery: Secondary | ICD-10-CM | POA: Diagnosis not present

## 2021-07-11 DIAGNOSIS — Z9049 Acquired absence of other specified parts of digestive tract: Secondary | ICD-10-CM

## 2021-07-11 DIAGNOSIS — Z20822 Contact with and (suspected) exposure to covid-19: Secondary | ICD-10-CM | POA: Diagnosis present

## 2021-07-11 DIAGNOSIS — O99019 Anemia complicating pregnancy, unspecified trimester: Secondary | ICD-10-CM | POA: Diagnosis present

## 2021-07-11 DIAGNOSIS — O288 Other abnormal findings on antenatal screening of mother: Secondary | ICD-10-CM

## 2021-07-11 DIAGNOSIS — Z98891 History of uterine scar from previous surgery: Secondary | ICD-10-CM

## 2021-07-11 DIAGNOSIS — O34211 Maternal care for low transverse scar from previous cesarean delivery: Secondary | ICD-10-CM | POA: Diagnosis not present

## 2021-07-11 DIAGNOSIS — O26893 Other specified pregnancy related conditions, third trimester: Secondary | ICD-10-CM | POA: Diagnosis present

## 2021-07-11 LAB — CBC
HCT: 35.9 % — ABNORMAL LOW (ref 36.0–46.0)
Hemoglobin: 11.2 g/dL — ABNORMAL LOW (ref 12.0–15.0)
MCH: 26.9 pg (ref 26.0–34.0)
MCHC: 31.2 g/dL (ref 30.0–36.0)
MCV: 86.1 fL (ref 80.0–100.0)
Platelets: 315 10*3/uL (ref 150–400)
RBC: 4.17 MIL/uL (ref 3.87–5.11)
RDW: 16.4 % — ABNORMAL HIGH (ref 11.5–15.5)
WBC: 13.5 10*3/uL — ABNORMAL HIGH (ref 4.0–10.5)
nRBC: 0 % (ref 0.0–0.2)

## 2021-07-11 LAB — RESP PANEL BY RT-PCR (FLU A&B, COVID) ARPGX2
Influenza A by PCR: NEGATIVE
Influenza B by PCR: NEGATIVE
SARS Coronavirus 2 by RT PCR: NEGATIVE

## 2021-07-11 LAB — TYPE AND SCREEN
ABO/RH(D): O POS
Antibody Screen: NEGATIVE

## 2021-07-11 SURGERY — Surgical Case
Anesthesia: Spinal

## 2021-07-11 MED ORDER — IBUPROFEN 600 MG PO TABS: 600.0000 mg | ORAL_TABLET | Freq: Four times a day (QID) | ORAL | Status: DC

## 2021-07-11 MED ORDER — DIPHENHYDRAMINE HCL 50 MG/ML IJ SOLN
INTRAMUSCULAR | Status: AC
  Filled 2021-07-11: qty 1

## 2021-07-11 MED ORDER — OXYTOCIN-SODIUM CHLORIDE 30-0.9 UT/500ML-% IV SOLN
INTRAVENOUS | Status: DC | PRN
  Administered 2021-07-11: 400 mL via INTRAVENOUS

## 2021-07-11 MED ORDER — WITCH HAZEL-GLYCERIN EX PADS: 1.0000 "application " | MEDICATED_PAD | CUTANEOUS | Status: DC | PRN

## 2021-07-11 MED ORDER — LACTATED RINGERS IV SOLN: INTRAVENOUS | Status: DC | PRN

## 2021-07-11 MED ORDER — FENTANYL CITRATE (PF) 100 MCG/2ML IJ SOLN
INTRAMUSCULAR | Status: DC | PRN
  Administered 2021-07-11: 15 ug via INTRATHECAL

## 2021-07-11 MED ORDER — GABAPENTIN 100 MG PO CAPS
300.0000 mg | ORAL_CAPSULE | Freq: Every day | ORAL | Status: DC
  Administered 2021-07-12 (×2): 300 mg via ORAL
  Filled 2021-07-11 (×2): qty 3

## 2021-07-11 MED ORDER — BUPIVACAINE IN DEXTROSE 0.75-8.25 % IT SOLN
INTRATHECAL | Status: DC | PRN
  Administered 2021-07-11: 1.6 mL via INTRATHECAL

## 2021-07-11 MED ORDER — COCONUT OIL OIL
1.0000 "application " | TOPICAL_OIL | Status: DC | PRN
  Administered 2021-07-12: 1 via TOPICAL

## 2021-07-11 MED ORDER — MEDROXYPROGESTERONE ACETATE 150 MG/ML IM SUSP: 150.0000 mg | INTRAMUSCULAR | Status: DC | PRN

## 2021-07-11 MED ORDER — KETOROLAC TROMETHAMINE 30 MG/ML IJ SOLN
30.0000 mg | Freq: Once | INTRAMUSCULAR | Status: AC
  Administered 2021-07-11: 22:00:00 30 mg via INTRAVENOUS

## 2021-07-11 MED ORDER — TETANUS-DIPHTH-ACELL PERTUSSIS 5-2.5-18.5 LF-MCG/0.5 IM SUSY: 0.5000 mL | PREFILLED_SYRINGE | Freq: Once | INTRAMUSCULAR | Status: DC

## 2021-07-11 MED ORDER — LACTATED RINGERS IV SOLN: INTRAVENOUS | Status: DC

## 2021-07-11 MED ORDER — SIMETHICONE 80 MG PO CHEW: 80.0000 mg | CHEWABLE_TABLET | ORAL | Status: DC | PRN

## 2021-07-11 MED ORDER — KETOROLAC TROMETHAMINE 30 MG/ML IJ SOLN
INTRAMUSCULAR | Status: AC
  Filled 2021-07-11: qty 1

## 2021-07-11 MED ORDER — OXYTOCIN-SODIUM CHLORIDE 30-0.9 UT/500ML-% IV SOLN: 2.5000 [IU]/h | INTRAVENOUS | Status: AC

## 2021-07-11 MED ORDER — MENTHOL 3 MG MT LOZG: 1.0000 | LOZENGE | OROMUCOSAL | Status: DC | PRN

## 2021-07-11 MED ORDER — PHENYLEPHRINE HCL-NACL 20-0.9 MG/250ML-% IV SOLN
INTRAVENOUS | Status: DC | PRN
  Administered 2021-07-11: 60 ug/min via INTRAVENOUS

## 2021-07-11 MED ORDER — ACETAMINOPHEN 10 MG/ML IV SOLN
INTRAVENOUS | Status: DC | PRN
  Administered 2021-07-11: 1000 mg via INTRAVENOUS

## 2021-07-11 MED ORDER — ONDANSETRON HCL 4 MG/2ML IJ SOLN
INTRAMUSCULAR | Status: AC
  Filled 2021-07-11: qty 2

## 2021-07-11 MED ORDER — FENTANYL CITRATE (PF) 100 MCG/2ML IJ SOLN
INTRAMUSCULAR | Status: AC
  Filled 2021-07-11: qty 2

## 2021-07-11 MED ORDER — MAGNESIUM HYDROXIDE 400 MG/5ML PO SUSP: 30.0000 mL | ORAL | Status: DC | PRN

## 2021-07-11 MED ORDER — SOD CITRATE-CITRIC ACID 500-334 MG/5ML PO SOLN
30.0000 mL | ORAL | Status: AC
  Administered 2021-07-11: 20:00:00 30 mL via ORAL
  Filled 2021-07-11: qty 30

## 2021-07-11 MED ORDER — STERILE WATER FOR IRRIGATION IR SOLN
Status: DC | PRN
  Administered 2021-07-11: 1

## 2021-07-11 MED ORDER — KETOROLAC TROMETHAMINE 30 MG/ML IJ SOLN
30.0000 mg | Freq: Four times a day (QID) | INTRAMUSCULAR | Status: AC
  Administered 2021-07-12 (×2): 30 mg via INTRAVENOUS
  Filled 2021-07-11 (×2): qty 1

## 2021-07-11 MED ORDER — POVIDONE-IODINE 10 % EX SWAB
2.0000 "application " | Freq: Once | CUTANEOUS | Status: AC
  Administered 2021-07-11: 2 via TOPICAL

## 2021-07-11 MED ORDER — ACETAMINOPHEN 10 MG/ML IV SOLN
INTRAVENOUS | Status: AC
  Filled 2021-07-11: qty 100

## 2021-07-11 MED ORDER — ACETAMINOPHEN 500 MG PO TABS
1000.0000 mg | ORAL_TABLET | Freq: Four times a day (QID) | ORAL | Status: DC
  Administered 2021-07-12 – 2021-07-13 (×7): 1000 mg via ORAL
  Filled 2021-07-11 (×7): qty 2

## 2021-07-11 MED ORDER — BUPIVACAINE HCL 0.5 % IJ SOLN
INTRAMUSCULAR | Status: DC | PRN
  Administered 2021-07-11: 30 mL

## 2021-07-11 MED ORDER — SENNOSIDES-DOCUSATE SODIUM 8.6-50 MG PO TABS
2.0000 | ORAL_TABLET | Freq: Every day | ORAL | Status: DC
  Filled 2021-07-11 (×2): qty 2

## 2021-07-11 MED ORDER — DIBUCAINE (PERIANAL) 1 % EX OINT: 1.0000 "application " | TOPICAL_OINTMENT | CUTANEOUS | Status: DC | PRN

## 2021-07-11 MED ORDER — PHENYLEPHRINE HCL-NACL 20-0.9 MG/250ML-% IV SOLN
INTRAVENOUS | Status: AC
  Filled 2021-07-11: qty 250

## 2021-07-11 MED ORDER — SOD CITRATE-CITRIC ACID 500-334 MG/5ML PO SOLN: 30.0000 mL | Freq: Once | ORAL | Status: DC

## 2021-07-11 MED ORDER — ENOXAPARIN SODIUM 40 MG/0.4ML IJ SOSY
40.0000 mg | PREFILLED_SYRINGE | INTRAMUSCULAR | Status: DC
  Administered 2021-07-12: 40 mg via SUBCUTANEOUS
  Filled 2021-07-11: qty 0.4

## 2021-07-11 MED ORDER — PRENATAL MULTIVITAMIN CH
1.0000 | ORAL_TABLET | Freq: Every day | ORAL | Status: DC
  Administered 2021-07-12 – 2021-07-13 (×2): 1 via ORAL
  Filled 2021-07-11 (×2): qty 1

## 2021-07-11 MED ORDER — PROMETHAZINE HCL 25 MG/ML IJ SOLN: 6.2500 mg | INTRAMUSCULAR | Status: DC | PRN

## 2021-07-11 MED ORDER — ONDANSETRON HCL 4 MG/2ML IJ SOLN
INTRAMUSCULAR | Status: DC | PRN
  Administered 2021-07-11: 4 mg via INTRAVENOUS

## 2021-07-11 MED ORDER — HYDROMORPHONE HCL 2 MG PO TABS: 4.0000 mg | ORAL_TABLET | Freq: Four times a day (QID) | ORAL | Status: DC | PRN

## 2021-07-11 MED ORDER — DIPHENHYDRAMINE HCL 50 MG/ML IJ SOLN
12.5000 mg | Freq: Once | INTRAMUSCULAR | Status: AC
  Administered 2021-07-11: 22:00:00 12.5 mg via INTRAVENOUS

## 2021-07-11 MED ORDER — HYDROMORPHONE HCL 1 MG/ML IJ SOLN: 0.2000 mg | INTRAMUSCULAR | Status: DC | PRN

## 2021-07-11 MED ORDER — SIMETHICONE 80 MG PO CHEW
80.0000 mg | CHEWABLE_TABLET | Freq: Three times a day (TID) | ORAL | Status: DC
  Administered 2021-07-12 – 2021-07-13 (×4): 80 mg via ORAL
  Filled 2021-07-11 (×4): qty 1

## 2021-07-11 MED ORDER — DIPHENHYDRAMINE HCL 25 MG PO CAPS: 25.0000 mg | ORAL_CAPSULE | Freq: Four times a day (QID) | ORAL | Status: DC | PRN

## 2021-07-11 MED ORDER — MEASLES, MUMPS & RUBELLA VAC IJ SOLR: 0.5000 mL | Freq: Once | INTRAMUSCULAR | Status: DC

## 2021-07-11 MED ORDER — CEFAZOLIN SODIUM-DEXTROSE 2-4 GM/100ML-% IV SOLN
2.0000 g | INTRAVENOUS | Status: AC
  Administered 2021-07-11: 20:00:00 2 g via INTRAVENOUS
  Filled 2021-07-11: qty 100

## 2021-07-11 MED ORDER — BUPIVACAINE HCL (PF) 0.5 % IJ SOLN
INTRAMUSCULAR | Status: AC
  Filled 2021-07-11: qty 30

## 2021-07-11 MED ORDER — HYDROMORPHONE HCL 1 MG/ML IJ SOLN
0.5000 mg | Freq: Once | INTRAMUSCULAR | Status: AC
  Administered 2021-07-11: 19:00:00 0.5 mg via INTRAVENOUS
  Filled 2021-07-11: qty 1

## 2021-07-11 MED ORDER — HYDROMORPHONE HCL 1 MG/ML IJ SOLN: 0.2500 mg | INTRAMUSCULAR | Status: DC | PRN

## 2021-07-11 SURGICAL SUPPLY — 33 items
BENZOIN TINCTURE PRP APPL 2/3 (GAUZE/BANDAGES/DRESSINGS) ×2 IMPLANT
CHLORAPREP W/TINT 26ML (MISCELLANEOUS) ×2 IMPLANT
CLAMP CORD UMBIL (MISCELLANEOUS) IMPLANT
CLOTH BEACON ORANGE TIMEOUT ST (SAFETY) ×2 IMPLANT
DRSG OPSITE POSTOP 4X10 (GAUZE/BANDAGES/DRESSINGS) ×2 IMPLANT
ELECT REM PT RETURN 9FT ADLT (ELECTROSURGICAL) ×2
ELECTRODE REM PT RTRN 9FT ADLT (ELECTROSURGICAL) ×1 IMPLANT
EXTRACTOR VACUUM M CUP 4 TUBE (SUCTIONS) IMPLANT
GAUZE SPONGE 4X4 12PLY STRL LF (GAUZE/BANDAGES/DRESSINGS) ×2 IMPLANT
GLOVE BIOGEL PI IND STRL 7.0 (GLOVE) ×2 IMPLANT
GLOVE BIOGEL PI IND STRL 7.5 (GLOVE) ×2 IMPLANT
GLOVE BIOGEL PI INDICATOR 7.0 (GLOVE) ×2
GLOVE BIOGEL PI INDICATOR 7.5 (GLOVE) ×2
GLOVE ECLIPSE 7.5 STRL STRAW (GLOVE) ×2 IMPLANT
GOWN STRL REUS W/TWL LRG LVL3 (GOWN DISPOSABLE) ×6 IMPLANT
KIT ABG SYR 3ML LUER SLIP (SYRINGE) IMPLANT
NDL HYPO 25X5/8 SAFETYGLIDE (NEEDLE) IMPLANT
NEEDLE HYPO 25X5/8 SAFETYGLIDE (NEEDLE) IMPLANT
NS IRRIG 1000ML POUR BTL (IV SOLUTION) ×2 IMPLANT
PACK C SECTION WH (CUSTOM PROCEDURE TRAY) ×2 IMPLANT
PAD ABD 7.5X8 STRL (GAUZE/BANDAGES/DRESSINGS) ×1 IMPLANT
PAD OB MATERNITY 4.3X12.25 (PERSONAL CARE ITEMS) ×2 IMPLANT
PENCIL SMOKE EVAC W/HOLSTER (ELECTROSURGICAL) ×2 IMPLANT
RTRCTR C-SECT PINK 25CM LRG (MISCELLANEOUS) ×2 IMPLANT
STRIP CLOSURE SKIN 1/2X4 (GAUZE/BANDAGES/DRESSINGS) ×2 IMPLANT
SUT VIC AB 0 CTX 36 (SUTURE) ×3
SUT VIC AB 0 CTX36XBRD ANBCTRL (SUTURE) ×3 IMPLANT
SUT VIC AB 2-0 CT1 27 (SUTURE) ×1
SUT VIC AB 2-0 CT1 TAPERPNT 27 (SUTURE) ×1 IMPLANT
SUT VIC AB 4-0 KS 27 (SUTURE) ×2 IMPLANT
TOWEL OR 17X24 6PK STRL BLUE (TOWEL DISPOSABLE) ×2 IMPLANT
TRAY FOLEY W/BAG SLVR 14FR LF (SET/KITS/TRAYS/PACK) ×2 IMPLANT
WATER STERILE IRR 1000ML POUR (IV SOLUTION) ×2 IMPLANT

## 2021-07-11 NOTE — Progress Notes (Signed)
To OR via stretcher. FHTs 130 when EFM removed for transfer to OB OR

## 2021-07-11 NOTE — MAU Provider Note (Signed)
Event Date/Time   First Provider Initiated Contact with Patient 07/11/21 1538       S: Ms. Brooke Mckee is a 30 y.o. G2P1001 at [redacted]w[redacted]d  who presents to MAU today complaining contractions since last night. She denies vaginal bleeding. She denies LOF. She reports normal fetal movement.    O: BP 116/62 (BP Location: Left Arm)    Pulse 67    Temp 98 F (36.7 C) (Oral)    Resp 20    Ht 5\' 7"  (1.702 m)    Wt 72.1 kg    LMP  (LMP Unknown)    SpO2 100%    BMI 24.90 kg/m  GENERAL: Well-developed, well-nourished female in no acute distress.  HEAD: Normocephalic, atraumatic.  CHEST: Normal effort of breathing, regular heart rate ABDOMEN: Soft, nontender, gravid  Cervical exam:  Dilation: Closed Exam by:: Milinda Cave, CNM   Fetal Monitoring: Baseline: 120 Variability: Present Accelerations: 10x10 Decelerations: None Contractions: Irritability graphed   A: SIUP at [redacted]w[redacted]d  Contractions Cat I FT Desires Repeat C/S   P: -Patient expresses concern with labor d/t history of colectomy. -Reassured that not currently having cervical dilation, despite contractions since last night. -Informed that NST reassuring, but not reactive. -Will send for BPP if no change after 40 minutes.   Gavin Pound, CNM 07/11/2021 3:39 PM  Reassessment (4:16 PM)  NST remains reassuring, but not reactive. Provider to bedside, patient informed that she will be sent for BPP. Familiar with procedure. Will await results and reassess upon return.   Reassessment (5:09 PM) -Ultrasonographer reports prelim 6/8 -Dr. Donalee Citrin states he will return call d/t other patient obligation.  -Consult with L&D team who recommends proceed with delivery. -Patient reports she last ate at noon. -Informed of preliminary findings. No questions. -L&D team to assume care.  Maryann Conners MSN, CNM Advanced Practice Provider, [facility]

## 2021-07-11 NOTE — Transfer of Care (Signed)
Immediate Anesthesia Transfer of Care Note  Patient: Brooke Mckee  Procedure(s) Performed: CESAREAN SECTION  Patient Location: PACU  Anesthesia Type:Spinal  Level of Consciousness: awake  Airway & Oxygen Therapy: Patient Spontanous Breathing  Post-op Assessment: Report given to RN and Post -op Vital signs reviewed and stable  Post vital signs: Reviewed and stable  Last Vitals:  Vitals Value Taken Time  BP 123/75 07/11/21 2141  Temp    Pulse 80 07/11/21 2143  Resp 16 07/11/21 2143  SpO2 100 % 07/11/21 2143  Vitals shown include unvalidated device data.  Last Pain:  Vitals:   07/11/21 1954  TempSrc:   PainSc: 5          Complications: No notable events documented.

## 2021-07-11 NOTE — Anesthesia Procedure Notes (Signed)
Spinal  Patient location during procedure: OR Start time: 07/11/2021 8:05 PM End time: 07/11/2021 8:10 PM Reason for block: surgical anesthesia Staffing Performed: anesthesiologist  Anesthesiologist: Brennan Bailey, MD Preanesthetic Checklist Completed: patient identified, IV checked, risks and benefits discussed, monitors and equipment checked, pre-op evaluation and timeout performed Spinal Block Patient position: sitting Prep: DuraPrep and site prepped and draped Patient monitoring: heart rate, continuous pulse ox and blood pressure Approach: midline Location: L3-4 Injection technique: single-shot Needle Needle type: Pencan  Needle gauge: 24 G Needle length: 10 cm Assessment Sensory level: T4 Events: CSF return Additional Notes Risks, benefits, and alternative discussed. Patient gave consent to procedure. Prepped and draped in sitting position. Clear CSF obtained. Positive terminal aspiration. No pain or paraesthesias with injection. Patient tolerated procedure well. Vital signs stable. Tawny Asal, MD

## 2021-07-11 NOTE — Progress Notes (Signed)
Dr. Daiva Huge, anesthesiologist, into see pt prior to surgery.

## 2021-07-11 NOTE — Anesthesia Preprocedure Evaluation (Signed)
Anesthesia Evaluation  Patient identified by MRN, date of birth, ID band Patient awake    Reviewed: Allergy & Precautions, NPO status , Patient's Chart, lab work & pertinent test results  History of Anesthesia Complications Negative for: history of anesthetic complications  Airway Mallampati: II  TM Distance: >3 FB Neck ROM: Full    Dental no notable dental hx.    Pulmonary neg pulmonary ROS,    Pulmonary exam normal        Cardiovascular negative cardio ROS Normal cardiovascular exam     Neuro/Psych  Headaches, negative psych ROS   GI/Hepatic Neg liver ROS, Ulcerative colitis   Endo/Other  negative endocrine ROS  Renal/GU negative Renal ROS  negative genitourinary   Musculoskeletal negative musculoskeletal ROS (+)   Abdominal   Peds  Hematology Hgb 11.2, Plt 315k   Anesthesia Other Findings Day of surgery medications reviewed with patient.  Reproductive/Obstetrics negative OB ROS                             Anesthesia Physical Anesthesia Plan  ASA: 2  Anesthesia Plan: Spinal   Post-op Pain Management: Ofirmev IV (intra-op), Toradol IV (intra-op) and Dilaudid IV   Induction:   PONV Risk Score and Plan: 4 or greater and Treatment may vary due to age or medical condition, Ondansetron and Dexamethasone  Airway Management Planned: Natural Airway  Additional Equipment: None  Intra-op Plan:   Post-operative Plan:   Informed Consent: I have reviewed the patients History and Physical, chart, labs and discussed the procedure including the risks, benefits and alternatives for the proposed anesthesia with the patient or authorized representative who has indicated his/her understanding and acceptance.       Plan Discussed with: CRNA  Anesthesia Plan Comments: (Will avoid morphine due to allergy and patient preference. Plan for IV dilaudid postoperatively. Daiva Huge, MD)         Anesthesia Quick Evaluation

## 2021-07-11 NOTE — MAU Note (Signed)
Pt up to BR and emptied bladder before transfer to OB OR.

## 2021-07-11 NOTE — Discharge Summary (Signed)
Postpartum Discharge Summary  Date of Service updated     Patient Name: Brooke Mckee DOB: August 06, 1990 MRN: [id number]  Date of admission: 07/11/2021 Delivery date:07/11/2021  Delivering provider: Woodroe Mode  Date of discharge: 07/13/2021  Admitting diagnosis: Polyhydramnios [O40.9XX0] Delivery of pregnancy by cesarean section [O82] Intrauterine pregnancy: [redacted]w[redacted]d    Secondary diagnosis:  Principal Problem:   Delivery of pregnancy by cesarean section Active Problems:   Anemia complicating pregnancy, unspecified trimester   History of C-section   Ulcerative colitis with complication ([facility]  Additional problems: None    Discharge diagnosis: Term Pregnancy Delivered                                              Post partum procedures: None Augmentation: N/A Complications: None  Hospital course: Sceduled C/S   30y.o. yo G2P2002 at 318w3das admitted to the hospital 07/11/2021 for scheduled cesarean section with the following indication:Elective Repeat. Proceeded due to BPP 6/10 with new diagnosis of polyhydramnios. Delivery details are as follows:  Membrane Rupture Time/Date: 8:38 PM ,07/11/2021   Delivery Method:C-Section, Low Transverse  Details of operation can be found in separate operative note.  Patient had an uncomplicated postpartum course.  She is ambulating, tolerating a regular diet, passing flatus, and urinating well. Patient is discharged home in stable condition on  07/13/21        Newborn Data: Birth date:07/11/2021  Birth time:8:38 PM  Gender:Female  Living status:Living  Apgars:8 ,9  Weight:3240 g     Magnesium Sulfate received: No BMZ received: No Rhophylac:No MMR:No T-DaP:Given prenatally Flu: No Transfusion:No  Physical exam  Vitals:   07/12/21 1154 07/12/21 1500 07/12/21 2055 07/13/21 0539  BP: 107/73 113/68 120/69 111/72  Pulse: 70 71 70 64  Resp: '16 16 16 16  ' Temp: 98.5 F (36.9 C) 98.5 F (36.9 C) 98.1 F (36.7 C) 98.2 F (36.8 C)   TempSrc: Oral Oral Axillary Oral  SpO2:      Weight:      Height:       General: alert, cooperative, and no distress Lochia: appropriate Uterine Fundus: firm Incision: Healing well with no significant drainage, Dressing is clean, dry, and intact, dry saturation present on the lateral portion of the honeycomb with dressing still intact.  DVT Evaluation: No significant calf/ankle edema. Labs: Lab Results  Component Value Date   WBC 11.5 (H) 07/12/2021   HGB 9.7 (L) 07/12/2021   HCT 30.0 (L) 07/12/2021   MCV 84.0 07/12/2021   PLT 242 07/12/2021   CMP Latest Ref Rng & Units 06/04/2021  Glucose 70 - 99 mg/dL 90  BUN 6 - 20 mg/dL 9  Creatinine 0.44 - 1.00 mg/dL 0.71  Sodium 135 - 145 mmol/L 131(L)  Potassium 3.5 - 5.1 mmol/L 4.3  Chloride 98 - 111 mmol/L 100  CO2 22 - 32 mmol/L 18(L)  Calcium 8.9 - 10.3 mg/dL 9.4  Total Protein 6.5 - 8.1 g/dL 7.7  Total Bilirubin 0.3 - 1.2 mg/dL 0.9  Alkaline Phos 38 - 126 U/L 90  AST 15 - 41 U/L 27  ALT 0 - 44 U/L 12   Edinburgh Score: Edinburgh Postnatal Depression Scale Screening Tool 07/12/2021  I have been able to laugh and see the funny side of things. 0  I have looked forward with enjoyment to things. 0  I have  blamed myself unnecessarily when things went wrong. 0  I have been anxious or worried for no good reason. 0  I have felt scared or panicky for no good reason. 0  Things have been getting on top of me. 0  I have been so unhappy that I have had difficulty sleeping. 0  I have felt sad or miserable. 0  I have been so unhappy that I have been crying. 0  The thought of harming myself has occurred to me. 0  Edinburgh Postnatal Depression Scale Total 0     After visit meds:  Allergies as of 07/13/2021       Reactions   Morphine Hives   All over body. Pt states she hast tolerated hydromorphone in the past        Medication List     TAKE these medications    acetaminophen 500 MG tablet Commonly known as: TYLENOL Take  2 tablets (1,000 mg total) by mouth every 6 (six) hours as needed.   albuterol 108 (90 Base) MCG/ACT inhaler Commonly known as: VENTOLIN HFA Inhale 2 puffs into the lungs every 6 (six) hours as needed for wheezing or shortness of breath.   HYDROmorphone 2 MG tablet Commonly known as: DILAUDID Take 1 tablet (2 mg total) by mouth every 6 (six) hours as needed for severe pain ((when tolerating fluids)).   ibuprofen 600 MG tablet Commonly known as: ADVIL Take 1 tablet (600 mg total) by mouth every 6 (six) hours as needed.   Iron 142 (45 Fe) MG Tbcr Take 142 mg by mouth daily.   norethindrone 0.35 MG tablet Commonly known as: Ortho Micronor Take 1 tablet (0.35 mg total) by mouth daily. Start taking on: July 29, 2021   PRENATAL VITAMINS PO Take 1 tablet by mouth daily.   senna-docusate 8.6-50 MG tablet Commonly known as: Senokot-S Take 2 tablets by mouth daily as needed for mild constipation.   sertraline 25 MG tablet Commonly known as: Zoloft Take 1 tablet (25 mg total) by mouth daily.         Discharge home in stable condition Infant Feeding: Breast Infant Disposition:home with mother Discharge instruction: per After Visit Summary and Postpartum booklet. Activity: Advance as tolerated. Pelvic rest for 6 weeks.  Diet: routine diet Future Appointments: Future Appointments  Date Time Provider [place]  07/19/2021  2:00 PM [facility] NURSE [facility]  08/09/2021  9:40 AM Lavena Bullion, DO [facility]  08/23/2021  3:35 PM Ardean Larsen, Mervyn Skeeters, CNM [facility] [place] [place]   Follow up Visit:  Message sent to [facility] by Dr Higinio Plan on 07/11/2021:   Please schedule this patient for a In person postpartum visit in 6 weeks with the following provider: Any provider. Additional Postpartum F/U:Incision check 1 week  High risk pregnancy complicated by:  UC Delivery mode:  C-Section, Low Transverse  Anticipated Birth Control:  POPs   07/13/2021 Patriciaann Clan,  DO

## 2021-07-11 NOTE — MAU Note (Signed)
Presents with c/o ctxs that 4-6 minutes apart, reports began ctxing last night.  Denies VB or LOF, losing mucus plug.  Endorses +FM, but less than usual.

## 2021-07-11 NOTE — Op Note (Signed)
Brooke Mckee PROCEDURE DATE: 07/11/2021  PREOPERATIVE DIAGNOSIS: Intrauterine pregnancy at  [redacted]w[redacted]d weeks gestation; non-reassuring fetal status  POSTOPERATIVE DIAGNOSIS: The same  PROCEDURE: Repeat Low Transverse Cesarean Section  SURGEON:  Dr. Emeterio Reeve   ASSISTANT: Dr. Darrelyn Hillock   INDICATIONS: Brooke Mckee is a 30 y.o. [id number] at [redacted]w[redacted]d scheduled for cesarean section secondary to non-reassuring fetal status. Evaluated and found to have a BPP 6/10 with new diagnosis of polyhydramnios. The risks of cesarean section discussed with the patient included but were not limited to: bleeding which may require transfusion or reoperation; infection which may require antibiotics; injury to bowel, bladder, ureters or other surrounding organs; injury to the fetus; need for additional procedures including hysterectomy in the event of a life-threatening hemorrhage; placental abnormalities wth subsequent pregnancies, incisional problems, thromboembolic phenomenon and other postoperative/anesthesia complications. The patient concurred with the proposed plan, giving informed written consent for the procedure.    FINDINGS:  Viable female infant in R OP presentation.  Apgars 8 and 9, weight, 7 pounds and 2 ounces.  Clear amniotic fluid.  Intact placenta, three vessel cord.  Normal uterus, fallopian tubes and ovaries bilaterally.  ANESTHESIA:   Spinal INTRAVENOUS FLUIDS: 2,000 ml ESTIMATED BLOOD LOSS: 300 ml URINE OUTPUT:  150 ml SPECIMENS: Placenta sent to L&D COMPLICATIONS: None immediate  PROCEDURE IN DETAIL:  The patient received intravenous antibiotics and had sequential compression devices applied to her lower extremities.  She was then taken to the operating room where spinal anesthesia was administered and was found to be adequate. She was then placed in a dorsal supine position with a leftward tilt, and prepped and draped in a sterile manner.  A foley catheter was placed into her bladder and  attached to constant gravity, which drained clear fluid throughout.  After an adequate timeout was performed, a Pfannenstiel skin incision was made with scalpel and carried through to the underlying layer of fascia. The fascia was incised in the midline and this incision was extended bilaterally using the Mayo scissors. Kocher clamps were applied to the superior aspect of the fascial incision and the underlying rectus muscles were dissected off bluntly and sharply. A similar process was carried out on the inferior aspect of the facial incision. The rectus muscles were separated in the midline bluntly and the peritoneum was entered bluntly. An Alexis retractor was placed to aid in visualization of the uterus.  Attention was turned to the lower uterine segment where a transverse hysterotomy was made with a scalpel and extended bilaterally bluntly. The infant was successfully delivered, and cord was clamped and cut and infant was handed over to awaiting neonatology team. Uterine massage was then administered and the placenta delivered intact with three-vessel cord. The uterus was then cleared of clot and debris.  The hysterotomy was closed with 0 Vicryl in a running locked fashion, and an imbricating layer was also placed with a 0 Vicryl. Overall, excellent hemostasis was noted. The abdomen and the pelvis were cleared of all clot and debris and the Ubaldo Glassing was removed. Hemostasis was confirmed on all surfaces.  The peritoneum was reapproximated using 2-0 vicryl running stitches. The fascia was then closed using 0 Vicryl in a running fashion. The subcutaneous layer was reapproximated with plain gut and the skin was closed with 4-0 vicryl. The patient tolerated the procedure well. Sponge, lap, instrument and needle counts were correct x 2. She was taken to the recovery room in stable condition.    Brooke Clan, DO 07/11/2021 9:34 PM

## 2021-07-11 NOTE — H&P (Addendum)
Brooke Mckee is a 30 y.o. female, G2P1001 at 38.3 weeks, presenting for contractions. Upon initial assessment cervix found to be closed, but NST only reassuring and not reactive. Patient sent for BPP which returned at 6/8 significant for polyhydramnios and no fetal breathing.   Patient Active Problem List   Diagnosis Date Noted   Pouchitis ([place]) 06/05/2021   Diarrhea of presumed infectious origin    Fibrocystic disease of breast 06/04/2021   Partial small bowel obstruction ([place]) 06/04/2021   Supervision of high risk pregnancy, antepartum 04/17/2021   History of C-section 16/04/9603   Anemia complicating pregnancy, unspecified trimester 03/22/2021   Proteinuria affecting pregnancy in first trimester 01/16/2021   Encounter for supervision of normal pregnancy in multigravida 01/03/2021   S/P primary low transverse C-section 12/29/2018   Migraine without status migrainosus, not intractable 09/12/2017   Ulcerative colitis with complication ([place]) 54/03/8118   Annual physical exam 01/28/2017   Osteoporosis without current pathological fracture 01/28/2017    History of present pregnancy:  Last evaluation:  07/02/2021 by Dr. Gardiner Fanti in office.  BP: 121/86  Pulse: 76  Weight: 155 lb 4.8 oz (70.4 kg)      Nursing Staff Provider  Office Location  [facility] Dating  Early [place]  Language  English  Anatomy US   normal in care everywhere with normal fup for non-visualized anatomy  Flu Vaccine  04/17/21 Genetic/Carrier Screen  NIPS:    AFP:    [place]:  TDaP Vaccine    Hgb A1C or  GTT Early  Third trimester  72     Glucose, 1 hour 65 - 179 mg/dL 78   Glucose, 2 hour 65 - 152 mg/dL 86      COVID Vaccine    LAB RESULTS   Rhogam   Blood Type   O pos  Baby Feeding Plan Breast Antibody   neg  Contraception OCPS Rubella  imm (all labs care everywhere)  Circumcision  RPR    NR  Pediatrician  Undecided  HBsAg    Neg   Support Person FOB HCVAb  <0.1  Prenatal Classes  HIV     Neg  BTL Consent  GBS     negative   VBAC Consent  Pap  normal at [facility] in may 2022       DME Rx [ ]  BP cuff [ ]  Weight Scale Waterbirth  [ ]  Class [ ]  Consent [ ]  CNM visit  PHQ9 & GAD7 [  ] new OB [  ] 28 weeks  [  ] 36 weeks Induction  [ ]  Orders Entered [ ] Foley Y/N    OB History     Gravida  2   Para  1   Term  1   Preterm  0   AB  0   Living  1      SAB  0   IAB  0   Ectopic  0   Multiple  0   Live Births  0             Past Medical History:  Diagnosis Date   Migraines    Pouchitis ([place])    Ulcerative colitis ([place])    Past Surgical History:  Procedure Laterality Date   BIOPSY  06/05/2021   Procedure: BIOPSY;  Surgeon: Lavena Bullion, DO;  Location: [facility];  Service: Gastroenterology;;   CESAREAN SECTION     COLON SURGERY     COLOPROCTECTOMY W/ [facility]  2012  for UC   FLEXIBLE SIGMOIDOSCOPY N/A 06/05/2021   Procedure: FLEXIBLE SIGMOIDOSCOPY;  Surgeon: Lavena Bullion, DO;  Location: [place];  Service: Gastroenterology;  Laterality: N/A;  unsedated procedure   TOTAL COLECTOMY  2012   with IPAA   Family History: family history includes Colitis in her father. Social History:  reports that she has never smoked. She has never used smokeless tobacco. She reports that she does not drink alcohol and does not use drugs.   Prenatal Transfer Tool  Maternal Diabetes: No Genetic Screening: Normal Maternal Ultrasounds/Referrals: Normal Fetal Ultrasounds or other Referrals:  None Maternal Substance Abuse:  No Significant Maternal Medications:  Meds include: Zoloft Significant Maternal Lab Results: Group B Strep negative   Maternal Assessment:  ROS: +Contractions, -LOF, -Vaginal Bleeding, +Fetal Movement  All other systems reviewed and negative.    Allergies  Allergen Reactions   Morphine Hives    All over body     Dilation: Closed Exam by:: Milinda Cave, CNM Blood pressure 116/62, pulse 67, temperature 98 F (36.7 C), temperature source  Oral, resp. rate 20, height 5\' 7"  (1.702 m), weight 72.1 kg, SpO2 100 %.  Physical Exam Vitals reviewed.  Constitutional:      Appearance: Normal appearance.  HENT:     Head: Normocephalic and atraumatic.  Eyes:     Conjunctiva/sclera: Conjunctivae normal.  Cardiovascular:     Rate and Rhythm: Normal rate.  Pulmonary:     Effort: Pulmonary effort is normal. No respiratory distress.  Abdominal:     Comments: Gravid, Appears AGA  Musculoskeletal:        General: Normal range of motion.     Cervical back: Normal range of motion.  Skin:    General: Skin is warm and dry.  Neurological:     Mental Status: She is alert and oriented to person, place, and time.  Psychiatric:        Mood and Affect: Mood normal.        Behavior: Behavior normal.        Thought Content: Thought content normal.    Fetal Assessment: [place]: -Pelvis: Adequate -EFW: Not Assessed -Presentation: Vertex  FHR: 135 bpm, Mod Var, -Decels, +Accels UCs: Q2-21min    Assessment IUP at 38.3 weeks Cat I FT Failed Antenatal Testing H/O Total Colectomy w/ IPAA Desires Repeat C/S  Plan: Admit to [facility] Routine C/S Orders  Patient last ate at noon. Dr. Eddie North at bedside to discuss R/B and obtain consent.  Loann Quill, MSN 07/11/2021, 5:32 PM  Attestation of Attending Supervision of Advanced Practitioner (CNM/NP/PA): Evaluation and management procedures were performed by the Advanced Practitioner under my supervision and collaboration.  I have reviewed the Advanced Practitioner's note and chart, and I agree with the management and plan. She will have repeat cesarean section due to BPP 6/8 and polyhydramnios The risks of surgery were discussed with the patient including but were not limited to: bleeding which may require transfusion or reoperation; infection which may require antibiotics; injury to bowel, bladder, ureters or other surrounding organs; injury to the fetus; need for additional procedures  including hysterectomy in the event of a life-threatening hemorrhage; formation of adhesions; placental abnormalities with subsequent pregnancies; incisional problems; thromboembolic phenomenon and other postoperative/anesthesia complications.  The patient concurred with the proposed plan, giving informed written consent for the procedure.   Patient has been NPO since 1200 she will remain NPO for procedure. Anesthesia and OR aware. Preoperative prophylactic antibiotics and SCDs ordered on call to  the OR.  To OR when ready.   Emeterio Reeve MD

## 2021-07-12 ENCOUNTER — Encounter: Payer: 59 | Admitting: Family Medicine

## 2021-07-12 ENCOUNTER — Encounter (HOSPITAL_COMMUNITY): Payer: Self-pay | Admitting: Obstetrics & Gynecology

## 2021-07-12 ENCOUNTER — Other Ambulatory Visit: Payer: Self-pay | Admitting: Family Medicine

## 2021-07-12 LAB — CBC
HCT: 30 % — ABNORMAL LOW (ref 36.0–46.0)
Hemoglobin: 9.7 g/dL — ABNORMAL LOW (ref 12.0–15.0)
MCH: 27.2 pg (ref 26.0–34.0)
MCHC: 32.3 g/dL (ref 30.0–36.0)
MCV: 84 fL (ref 80.0–100.0)
Platelets: 242 10*3/uL (ref 150–400)
RBC: 3.57 MIL/uL — ABNORMAL LOW (ref 3.87–5.11)
RDW: 16.2 % — ABNORMAL HIGH (ref 11.5–15.5)
WBC: 11.5 10*3/uL — ABNORMAL HIGH (ref 4.0–10.5)
nRBC: 0 % (ref 0.0–0.2)

## 2021-07-12 MED ORDER — SERTRALINE HCL 25 MG PO TABS
25.0000 mg | ORAL_TABLET | Freq: Every day | ORAL | Status: DC
  Filled 2021-07-12: qty 1

## 2021-07-12 MED ORDER — HYDROMORPHONE HCL 2 MG PO TABS
2.0000 mg | ORAL_TABLET | Freq: Four times a day (QID) | ORAL | Status: DC | PRN
  Administered 2021-07-12 – 2021-07-13 (×2): 2 mg via ORAL
  Filled 2021-07-12 (×2): qty 1

## 2021-07-12 MED ORDER — FERROUS SULFATE 325 (65 FE) MG PO TABS
325.0000 mg | ORAL_TABLET | ORAL | Status: DC
  Administered 2021-07-12: 325 mg via ORAL
  Filled 2021-07-12: qty 1

## 2021-07-12 MED ORDER — OXYTOCIN-SODIUM CHLORIDE 30-0.9 UT/500ML-% IV SOLN
INTRAVENOUS | Status: AC
  Administered 2021-07-12: 2.5 [IU]/h via INTRAVENOUS
  Filled 2021-07-12: qty 500

## 2021-07-12 NOTE — Lactation Note (Signed)
This note was copied from a baby's chart. Lactation Consultation Note  Patient Name: Brooke Mckee NATFT'D Date: 07/12/2021 Reason for consult: Initial assessment;Early term 37-38.6wks Age:32 hours   P2 mother whose infant is now 54 hours old.  This is an ETI at 38+3 weeks.  Room was dark; family just beginning to awaken when I arrived.  Mother reported that baby has been latching and feeding well to the left breast, however, she has difficulty latching to the right breast.  Offered to return at the next feeding for assistance.  Mother appreciative and will call for help.  Further education will be provided at that time.   Maternal Data Has patient been taught Hand Expression?: Yes  Feeding Mother's Current Feeding Choice: Breast Milk  LATCH Score                    Lactation Tools Discussed/Used    Interventions    Discharge Pump: Personal Lucas Program: No  Consult Status Consult Status: Follow-up Date: 07/13/21 Follow-up type: In-patient    Brooke Mckee 07/12/2021, 7:32 AM

## 2021-07-12 NOTE — Social Work (Signed)
MOB was referred for history of depression.  * Referral screened out by Clinical Social Worker because none of the following criteria appear to apply:  ~ History of anxiety/depression during this pregnancy, or of post-partum depression following prior delivery. ~ Diagnosis of anxiety and/or depression within last 3 years OR * MOB's symptoms currently being treated with medication and/or therapy. Per chart review, MOB takes Zoloft for depression symptoms.   Please contact the Clinical Social Worker if needs arise, by [facility] request, or if MOB scores greater than 9/yes to question 10 on Edinburgh Postpartum Depression Screen.   Kathrin Greathouse, MSW, LCSW Women's and College Worker  [phone] 07/12/2021  8:27 AM

## 2021-07-12 NOTE — Progress Notes (Addendum)
POSTPARTUM PROGRESS NOTE  Subjective: Brooke Mckee is a 30 y.o. [id number] s/p rLTCS at [redacted]w[redacted]d.  She reports she doing well. No acute events overnight. Has just began to stand this morning, denies any dizziness. Has not yet had a diet. Feels hungry and wants to order breakfast Denies nausea or vomiting. She has passed flatus. Pain is well controlled.  Lochia is appropriate.  Objective: Blood pressure 105/67, pulse 62, temperature 98.3 F (36.8 C), temperature source Oral, resp. rate 18, height 5\' 7"  (1.702 m), weight 72.1 kg, SpO2 98 %, unknown if currently breastfeeding.  Physical Exam:  General: alert, cooperative and no distress Chest: no respiratory distress Abdomen: soft, non-tender, incision c/d/i Uterine Fundus: firm and at level of umbilicus Extremities: No calf swelling or tenderness   no edema  Recent Labs    07/11/21 1750 07/12/21 0431  HGB 11.2* 9.7*  HCT 35.9* 30.0*    Assessment/Plan: Brooke Mckee is a 30 y.o. [id number] s/p rLTCS at [redacted]w[redacted]d for BPP 6/10 and poly.  Routine Postpartum Care: Doing well, pain well-controlled.  -- Continue routine care, lactation support  -- Contraception: POPs -- Feeding: breast  #Anemia: Hgb 11.2>9.7, will continue PO iron 325 mg every other day.   Dispo: Plan for discharge home in 24-48h.  Brooke Barter, MD PGY-2 07/12/2021 7:56 AM

## 2021-07-12 NOTE — Anesthesia Postprocedure Evaluation (Signed)
Anesthesia Post Note  Patient: Retail buyer  Procedure(s) Performed: CESAREAN SECTION     Patient location during evaluation: PACU Anesthesia Type: Spinal Level of consciousness: awake and alert and oriented Pain management: pain level controlled Vital Signs Assessment: post-procedure vital signs reviewed and stable Respiratory status: spontaneous breathing, nonlabored ventilation and respiratory function stable Cardiovascular status: blood pressure returned to baseline Postop Assessment: no apparent nausea or vomiting, spinal receding, no headache and no backache Anesthetic complications: no   No notable events documented.  Last Vitals:  Vitals:   07/11/21 2253 07/12/21 0007  BP: 108/69 105/61  Pulse: 71 73  Resp: 16 18  Temp: 36.8 C 37 C  SpO2: 97% 97%    Last Pain:  Vitals:   07/12/21 0007  TempSrc: Oral  PainSc:    Pain Goal:                   Marthenia Rolling

## 2021-07-12 NOTE — Lactation Note (Signed)
This note was copied from a baby's chart. Lactation Consultation Note  Patient Name: Brooke Mckee JASNK'N Date: 07/12/2021 Reason for consult: Follow-up assessment;Early term 37-38.6wks Age:32 hours  LC Follow Up Consult:  Arrived to find baby "Brooke Mckee" at the breast and feeding.  Mother reported he had been feeding for approximately 20 minutes.  He appeared to have a shallow latch.  Offered to assist with positioning and latching; mother receptive.  Asked mother to remove "Brooke Mckee" from the breast; positional stripe noted.  Assisted to latch deeply and mother noted improvement with depth. After a few good sucks she was interested in having my help with latching on the right breast which has been more difficult for her.  Right nipple with slight bruising.  Assisted to latch deeply and mother denied pain.  Again, she was able to note good depth and "Brooke Mckee" continued to suck effectively.    Reviewed breast feeding basics and "Brooke Mckee" was still feeding when I left the room.  Mother has received coconut oil from her RN and will continue to use this for nipple comfort.   Maternal Data    Feeding Mother's Current Feeding Choice: Breast Milk  LATCH Score                    Lactation Tools Discussed/Used    Interventions    Discharge    Consult Status Consult Status: Follow-up Date: 07/13/21 Follow-up type: In-patient    Brooke Mckee 07/12/2021, 6:37 PM

## 2021-07-12 NOTE — Lactation Note (Deleted)
This note was copied from a baby's chart. Lactation Consultation Note  Patient Name: Brooke Mckee [id number] Date: 07/12/2021 Reason for consult: Initial assessment;Early term 37-38.6wks Age:32 hours   Initial LC Consult:  Attempted to visit with family, however, she had just received visitors and would appreciate a return visit.  Will have evening shift LC follow up.  RN updated.   Maternal Data    Feeding Mother's Current Feeding Choice: Breast Milk and Formula  LATCH Score                    Lactation Tools Discussed/Used    Interventions    Discharge    Consult Status Consult Status: Follow-up Date: 07/12/21 Follow-up type: In-patient    Little Ishikawa 07/12/2021, 6:19 PM

## 2021-07-13 ENCOUNTER — Other Ambulatory Visit (HOSPITAL_COMMUNITY): Payer: Self-pay

## 2021-07-13 ENCOUNTER — Encounter (HOSPITAL_COMMUNITY): Payer: Self-pay | Admitting: Obstetrics & Gynecology

## 2021-07-13 MED ORDER — IBUPROFEN 600 MG PO TABS
600.0000 mg | ORAL_TABLET | Freq: Four times a day (QID) | ORAL | 0 refills | Status: DC | PRN
  Filled 2021-07-13: qty 60, 15d supply, fill #0

## 2021-07-13 MED ORDER — NORETHINDRONE 0.35 MG PO TABS
1.0000 | ORAL_TABLET | Freq: Every day | ORAL | 1 refills | Status: DC
  Filled 2021-07-13: qty 28, 28d supply, fill #0
  Filled 2021-09-11: qty 28, 28d supply, fill #1

## 2021-07-13 MED ORDER — ACETAMINOPHEN 500 MG PO TABS: 1000.0000 mg | ORAL_TABLET | Freq: Four times a day (QID) | ORAL | 0 refills | Status: DC | PRN

## 2021-07-13 MED ORDER — HYDROMORPHONE HCL 2 MG PO TABS
2.0000 mg | ORAL_TABLET | Freq: Four times a day (QID) | ORAL | 0 refills | Status: DC | PRN
  Filled 2021-07-13: qty 5, 2d supply, fill #0

## 2021-07-13 MED ORDER — SENNOSIDES-DOCUSATE SODIUM 8.6-50 MG PO TABS
2.0000 | ORAL_TABLET | Freq: Every day | ORAL | 0 refills | Status: DC | PRN
  Filled 2021-07-13: qty 20, 10d supply, fill #0

## 2021-07-13 NOTE — Lactation Note (Signed)
This note was copied from a baby's chart. Lactation Consultation Note  Patient Name: Boy Blasa Raisch [id number] Date: 07/13/2021   Age:32 hours P2, ETI female infant with -7% weight loss. Per mom, she feels infant is latching well, he breastfeed after circumcision: 10 minutes at 1200 pm and then again for 20 minutes at 1500 pm. LC did not observe latch at this time, infant is asleep in mom's arms.  Mom only question for LC is that she has hx of oversupply with her 1st child whom she breastfeed for 15 months and she noticing the same thing with 2nd infant.  LC discussed if infant is choking with let down, pre-pump breast with pump prior to latch, then latch infant and offer pumped breast milk afterwards to ensure he is receiving the hind milk to help with growth.  LC discussed engorgement treatment and prevention, signs of dehydration in infant and how to know if breastfeeding is going well. LC discussed community resources after hospital discharge: [facility] outpatient clinic, [facility] hotline and online breastfeeding support group.  Mom will continue to breastfeed infant according to feeding cues, 8 to 12+ or more times within 24 hours, skin to skin. Mom knows what to do concerning if she has oversupply with breastfeeding.   Maternal Data    Feeding    LATCH Score                    Lactation Tools Discussed/Used    Interventions    Discharge    Consult Status      Vicente Serene 07/13/2021, 4:15 PM

## 2021-07-13 NOTE — Lactation Note (Signed)
This note was copied from a baby's chart. Lactation Consultation Note  Patient Name: Boy Ellanie Oppedisano HNPMV'A Date: 07/13/2021   Age:32 hours P2, ETI female infant -7% weight loss. Mom would like LC come back later today infant is currently having his circumcision.  Maternal Data    Feeding    LATCH Score                    Lactation Tools Discussed/Used    Interventions    Discharge    Consult Status      Vicente Serene 07/13/2021, 11:20 AM

## 2021-07-17 ENCOUNTER — Inpatient Hospital Stay (HOSPITAL_COMMUNITY): Admit: 2021-07-17 | Payer: 59 | Admitting: Family Medicine

## 2021-07-19 ENCOUNTER — Other Ambulatory Visit: Payer: Self-pay

## 2021-07-19 ENCOUNTER — Ambulatory Visit (INDEPENDENT_AMBULATORY_CARE_PROVIDER_SITE_OTHER): Payer: 59

## 2021-07-19 VITALS — BP 128/88 | HR 77 | Wt 142.2 lb

## 2021-07-19 DIAGNOSIS — Z5189 Encounter for other specified aftercare: Secondary | ICD-10-CM

## 2021-07-19 NOTE — Progress Notes (Signed)
Patient was assessed and managed by nursing staff during this encounter. I have reviewed the chart and agree with the documentation and plan. I have also made any necessary editorial changes.  Griffin Basil, MD 07/19/2021 4:59 PM

## 2021-07-19 NOTE — Progress Notes (Signed)
Incision Check Visit  Here for incision check following repeat c-section on 07/11/21. Incision is open to air. Appears clean and dry with fully approximated edges. Pt reports itching to right side of incision. Redness to area surrounding incision. [name], MD to bedside for assessment. Recommends oral or topical Benadryl for itching. Gives verbal order for Diflucan for any concern about yeast infection, pt declines. Pt states she will try topical Benadryl and follow up as needed.   Pain is well managed without any medication. Having daily bowel movement, no longer taking stool softener. Reports breast feeding is going well. Offered lactation services if needed. Will follow up at [facility] appt on 08/23/20.  [name], RN 07/19/2021  2:28 PM

## 2021-07-24 ENCOUNTER — Telehealth (HOSPITAL_COMMUNITY): Payer: Self-pay

## 2021-07-24 NOTE — Telephone Encounter (Signed)
"  I'd say I'm 95% healed. I feel good. Things are much easier this time around." Patient has no questions or concerns about her healing.  "Eating well and doing good. Baby sleeps in a bassinet." RN reviewed ABC's of safe sleep with patient. Patient declines any questions or concerns about baby.  EPDS score is 2.  Sharyn Lull [facility] 07/24/2021,1835

## 2021-08-09 ENCOUNTER — Ambulatory Visit: Payer: 59 | Admitting: Gastroenterology

## 2021-08-15 ENCOUNTER — Other Ambulatory Visit (HOSPITAL_BASED_OUTPATIENT_CLINIC_OR_DEPARTMENT_OTHER): Payer: Self-pay

## 2021-08-23 ENCOUNTER — Ambulatory Visit (INDEPENDENT_AMBULATORY_CARE_PROVIDER_SITE_OTHER): Payer: 59 | Admitting: Student

## 2021-08-23 ENCOUNTER — Encounter: Payer: Self-pay | Admitting: Student

## 2021-08-23 ENCOUNTER — Other Ambulatory Visit: Payer: Self-pay

## 2021-08-23 ENCOUNTER — Other Ambulatory Visit (HOSPITAL_BASED_OUTPATIENT_CLINIC_OR_DEPARTMENT_OTHER): Payer: Self-pay

## 2021-08-23 MED ORDER — SERTRALINE HCL 25 MG PO TABS
50.0000 mg | ORAL_TABLET | Freq: Every day | ORAL | 1 refills | Status: DC
Start: 2021-08-23 — End: 2022-08-13
  Filled 2021-08-23: qty 90, 90d supply, fill #0
  Filled 2021-09-11: qty 60, 30d supply, fill #0
  Filled 2021-11-14: qty 60, 30d supply, fill #1
  Filled 2022-01-02: qty 180, 90d supply, fill #2

## 2021-08-23 NOTE — Progress Notes (Signed)
[place] Partum Visit Note  Brooke Mckee is a 31 y.o. G23P2002 female who presents for a postpartum visit. She is 6 weeks 1 day postpartum following a repeat cesarean section.  I have fully reviewed the prenatal and intrapartum course. The delivery was at 38w 3d.  Anesthesia: spinal. Postpartum course has been . Baby is doing well. Baby is feeding by breast. Bleeding no bleeding. Bowel function is normal. Bladder function is normal. Patient is sexually active. Contraception method is oral progesterone-only contraceptive. Postpartum depression screening: positive.  She is taking 25 mg of zoloft. Reports that things are still hard with new baby, husband work schedule but that it is getting better.    The pregnancy intention screening data noted above was reviewed. Potential methods of contraception were discussed. The patient elected to proceed with micronor.   Edinburgh Postnatal Depression Scale - 08/23/21 1559       Edinburgh Postnatal Depression Scale:  In the Past 7 Days   I have been able to laugh and see the funny side of things. 0    I have looked forward with enjoyment to things. 0    I have blamed myself unnecessarily when things went wrong. 0    I have been anxious or worried for no good reason. 2    I have felt scared or panicky for no good reason. 2    Things have been getting on top of me. 0    I have been so unhappy that I have had difficulty sleeping. 2    I have felt sad or miserable. 2    I have been so unhappy that I have been crying. 2    The thought of harming myself has occurred to me. 2    Edinburgh Postnatal Depression Scale Total 12             Health Maintenance Due  Topic Date Due   Hepatitis C Screening  Never done      Review of Systems A comprehensive review of systems was negative.  Objective:  BP 125/85    Pulse 99    Wt 133 lb (60.3 kg)    LMP  (LMP Unknown)    Breastfeeding Yes    BMI 20.83 kg/m    General:  alert, cooperative, and no  distress   Breasts:  not indicated  Lungs: No SOB  Heart:  Not done  Abdomen: Not done    Wound well approximated incision  GU exam:  not indicated       Assessment:    Healthy  postpartum exam.   Plan:   Essential components of care per ACOG recommendations:  1.  Mood and well being: Patient with negative depression screening today. Reviewed local resources for support. Patient to increase zoloft to 50 mg and will seek new appt with psychiatrist. Refills given on Zoloft.  - Patient tobacco use? No.   - hx of drug use? No.    2. Infant care and feeding:  -Patient currently breastmilk feeding?  -Social determinants of health (SDOH) reviewed in EPIC. No concerns. The following needs were identified:  mental health follow up. Patient will see Roselyn Reef.   3. Sexuality, contraception and birth spacing - Patient does not want a pregnancy in the next year.  Desired family size is 2 children.  - Reviewed forms of contraception in tiered fashion. Patient desired oral progesterone-only contraceptive today.   - Discussed birth spacing of 18 months  4. Sleep and fatigue -Encouraged  family/partner/community support of 4 hrs of uninterrupted sleep to help with mood and fatigue  5. Physical Recovery  - Discussed patients delivery and complications. She describes her labor as good. - Patient had a C-section repeat; no problems after deliver. Patient had a  na  laceration. Perineal healing reviewed. Patient expressed understanding - Patient has urinary incontinence? No. - Patient is safe to resume physical and sexual activity  6.  Health Maintenance - HM due items addressed No -   - Last pap smear . Patient had pap smear in 2022 with [facility].  Pap smear not done at today's visit.  -Breast Cancer screening indicated? No.  -Patient will inform us if she wants to switch to mirena IUD.   7. Chronic Disease/Pregnancy Condition follow up: None  - PCP follow up  [place],  [place] [facility] [place]

## 2021-08-23 NOTE — Patient Instructions (Signed)
[facility] [address] [place], [place] [address] [phone] (main office) [phone] (Patric Vanpelt's office)   

## 2021-08-29 ENCOUNTER — Telehealth: Payer: Self-pay | Admitting: Gastroenterology

## 2021-08-29 ENCOUNTER — Ambulatory Visit: Payer: 59 | Admitting: Gastroenterology

## 2021-08-29 NOTE — Telephone Encounter (Signed)
Good Morning Dr. Bryan Lemma,  Patient called and stated that she wanted to cancel appointment with you today at 10:40 due to "not having anything to discuss".

## 2021-08-29 NOTE — BH Specialist Note (Unsigned)
Integrated Behavioral Health via Telemedicine Visit  08/29/2021 Brooke Mckee [id number]  Number of [place] visits: 1 Session Start time: 1:15***  Session End time: 2:15*** Total time: {IBH Total Time:21014050}  Referring Provider: *** Patient/Family location: Home*** [facility] Provider location: [facility] [place] [facility]  All persons participating in visit: Patient *** and [place] ***  Types of Service: {CHL AMB TYPE OF SERVICE:[phone]}  I connected with Brooke Mckee and/or Brooke Mckee's {family members:20773} via  Telephone or Geologist, engineering  (Video is Tree surgeon) and verified that I am speaking with the correct person using two identifiers. Discussed confidentiality: {YES/NO:21197}  I discussed the limitations of telemedicine and the availability of in person appointments.  Discussed there is a possibility of technology failure and discussed alternative modes of communication if that failure occurs.  I discussed that engaging in this telemedicine visit, they consent to the provision of behavioral healthcare and the services will be billed under their insurance.  Patient and/or legal guardian expressed understanding and consented to Telemedicine visit: {YES/NO:21197}  Presenting Concerns: Patient and/or family reports the following symptoms/concerns: *** Duration of problem: ***; Severity of problem: {Mild/Moderate/Severe:20260}  Patient and/or Family's Strengths/Protective Factors: {CHL AMB BH PROTECTIVE [phone]}  Goals Addressed: Patient will:  Reduce symptoms of: {IBH Symptoms:21014056}   Increase knowledge and/or ability of: {IBH Patient Tools:21014057}   Demonstrate ability to: {IBH Goals:21014053}  Progress towards Goals: {CHL AMB BH PROGRESS TOWARDS GOALS:[phone]}  Interventions: Interventions utilized:  {IBH Interventions:21014054} Standardized  Assessments completed: {IBH Screening Tools:21014051}  Patient and/or Family Response: ***  Assessment: Patient currently experiencing ***.   Patient may benefit from ***.  Plan: Follow up with behavioral health clinician on : *** Behavioral recommendations: *** Referral(s): {IBH Referrals:21014055}  I discussed the assessment and treatment plan with the patient and/or parent/guardian. They were provided an opportunity to ask questions and all were answered. They agreed with the plan and demonstrated an understanding of the instructions.   They were advised to call back or seek an in-person evaluation if the symptoms worsen or if the condition fails to improve as anticipated.  Caroleen Hamman Aadi Bordner, LCSW

## 2021-09-04 ENCOUNTER — Other Ambulatory Visit (HOSPITAL_BASED_OUTPATIENT_CLINIC_OR_DEPARTMENT_OTHER): Payer: Self-pay

## 2021-09-11 ENCOUNTER — Other Ambulatory Visit (HOSPITAL_BASED_OUTPATIENT_CLINIC_OR_DEPARTMENT_OTHER): Payer: Self-pay

## 2021-10-01 ENCOUNTER — Encounter (HOSPITAL_BASED_OUTPATIENT_CLINIC_OR_DEPARTMENT_OTHER): Payer: Self-pay | Admitting: Pharmacist

## 2021-10-01 ENCOUNTER — Other Ambulatory Visit (HOSPITAL_BASED_OUTPATIENT_CLINIC_OR_DEPARTMENT_OTHER): Payer: Self-pay

## 2021-10-05 ENCOUNTER — Telehealth: Payer: Self-pay | Admitting: Family Medicine

## 2021-10-05 DIAGNOSIS — Z3041 Encounter for surveillance of contraceptive pills: Secondary | ICD-10-CM

## 2021-10-05 NOTE — Telephone Encounter (Signed)
Patient called in stating she needs a refill on her birth control prescription and have the prescription sent to the [facility] that is on file.  ?

## 2021-10-10 ENCOUNTER — Other Ambulatory Visit (HOSPITAL_BASED_OUTPATIENT_CLINIC_OR_DEPARTMENT_OTHER): Payer: Self-pay

## 2021-10-10 MED ORDER — NORETHINDRONE 0.35 MG PO TABS
1.0000 | ORAL_TABLET | Freq: Every day | ORAL | 3 refills | Status: DC
  Filled 2021-10-10: qty 84, 84d supply, fill #0
  Filled 2022-01-02: qty 84, 84d supply, fill #1
  Filled 2022-03-26: qty 84, 84d supply, fill #2
  Filled 2022-05-30: qty 84, 84d supply, fill #3

## 2021-10-10 NOTE — Telephone Encounter (Signed)
Patient called front office, call transferred to RN. Patient states she is still considering Mirena IUD but would like to go ahead with prescription of birth control pills. Initially given a 2 month supply on discharge. Refilled birth control pills for full year. Explained pt will need to return for annual visit in January if she doesn't return for Mirena. ?

## 2021-10-10 NOTE — Addendum Note (Signed)
Addended by: [name] on: 10/10/2021 09:05 AM ? ? Modules accepted: Orders ? ?

## 2021-11-09 ENCOUNTER — Other Ambulatory Visit (HOSPITAL_BASED_OUTPATIENT_CLINIC_OR_DEPARTMENT_OTHER): Payer: Self-pay

## 2021-11-09 ENCOUNTER — Telehealth: Payer: Self-pay | Admitting: Gastroenterology

## 2021-11-09 ENCOUNTER — Other Ambulatory Visit: Payer: Self-pay

## 2021-11-09 ENCOUNTER — Encounter: Payer: Self-pay | Admitting: Gastroenterology

## 2021-11-09 DIAGNOSIS — K51819 Other ulcerative colitis with unspecified complications: Secondary | ICD-10-CM

## 2021-11-09 DIAGNOSIS — R197 Diarrhea, unspecified: Secondary | ICD-10-CM

## 2021-11-09 DIAGNOSIS — K91850 Pouchitis: Secondary | ICD-10-CM

## 2021-11-09 MED ORDER — CIPROFLOXACIN HCL 500 MG PO TABS: 500.0000 mg | ORAL_TABLET | Freq: Two times a day (BID) | ORAL | 0 refills | Status: DC

## 2021-11-09 MED ORDER — CIPROFLOXACIN HCL 500 MG PO TABS
500.0000 mg | ORAL_TABLET | Freq: Two times a day (BID) | ORAL | 0 refills | Status: DC
  Filled 2021-11-09: qty 28, 14d supply, fill #0

## 2021-11-09 NOTE — Telephone Encounter (Signed)
Spoke with pt. Pt requested cipro be sent to [facility] in [place].  ?

## 2021-11-09 NOTE — Telephone Encounter (Signed)
Pt states she thinks she has pouchitis again. Pt states she usually gets pouchitis 1-2x per year. Pt states about 2-3 days ago she started experiencing abd cramping, yellow liquid stool 30 times per day, and nausea. Pt denies fever. Pt requesting antibiotics.  ?

## 2021-11-09 NOTE — Telephone Encounter (Signed)
Patient with a known history of UC w/ prior pouchitis, most recently treated while inpatient in 05/2021.  Has not had follow-up since then.  Recommend the following: ?- Check GI PCR panel, fecal calprotectin ?- Ciprofloxacin 500 mg every 12 hours x14 days, RF 0 ?- Metronidazole 500 mg every 12 hours x14 days, RF 0 ?- Schedule follow-up appointment in GI clinic ?

## 2021-11-09 NOTE — Telephone Encounter (Signed)
Inbound call from patient stating that she would like a nurse to call her about the symptoms she is experiencing. Please advise.  ?

## 2021-11-09 NOTE — Telephone Encounter (Signed)
See 4/14 mychart message.  ?

## 2021-11-09 NOTE — Telephone Encounter (Signed)
Yes, okay to just use Cipro only.  Thank you for letting me know that she is breast-feeding.  Agree with holding off on Flagyl as recommended by her OB.  While ciprofloxacin is also present in breastmilk, the relative infant dose is well below therapeutic range and generally considered acceptable to use. ?

## 2021-11-09 NOTE — Addendum Note (Signed)
Addended by: Berniece Salines A on: 11/09/2021 05:09 PM ? ? Modules accepted: Orders ? ?

## 2021-11-09 NOTE — Telephone Encounter (Signed)
Spoke with pt and gave pt recommendations. Pt is scheduled for f/u on 11/22/21 at 1:20 pm with Dr. Bryan Lemma. Lab orders entered for [facility] office. Pt verbalized understanding. Pt is currently breastfeeding. Advised pt to contact obgyn or pediatrician for advice on antibiotics and breastfeeding. Pt stated she will call back to let us know what they say.  ?

## 2021-11-12 ENCOUNTER — Other Ambulatory Visit (HOSPITAL_BASED_OUTPATIENT_CLINIC_OR_DEPARTMENT_OTHER): Payer: Self-pay

## 2021-11-12 ENCOUNTER — Other Ambulatory Visit: Payer: 59

## 2021-11-12 DIAGNOSIS — K91850 Pouchitis: Secondary | ICD-10-CM | POA: Diagnosis not present

## 2021-11-12 DIAGNOSIS — R197 Diarrhea, unspecified: Secondary | ICD-10-CM | POA: Diagnosis not present

## 2021-11-12 DIAGNOSIS — K51819 Other ulcerative colitis with unspecified complications: Secondary | ICD-10-CM

## 2021-11-14 ENCOUNTER — Other Ambulatory Visit (HOSPITAL_BASED_OUTPATIENT_CLINIC_OR_DEPARTMENT_OTHER): Payer: Self-pay

## 2021-11-14 LAB — GI PROFILE, STOOL, PCR

## 2021-11-16 LAB — CALPROTECTIN, FECAL: Calprotectin, Fecal: 45 ug/g (ref 0–120)

## 2021-11-22 ENCOUNTER — Telehealth: Payer: 59 | Admitting: Gastroenterology

## 2021-11-22 DIAGNOSIS — K91850 Pouchitis: Secondary | ICD-10-CM

## 2021-11-22 DIAGNOSIS — K51919 Ulcerative colitis, unspecified with unspecified complications: Secondary | ICD-10-CM

## 2021-11-22 MED ORDER — CIPROFLOXACIN HCL 500 MG PO TABS: 500.0000 mg | ORAL_TABLET | Freq: Two times a day (BID) | ORAL | 0 refills | Status: AC

## 2021-11-22 NOTE — Patient Instructions (Signed)
If you are age 33 or older, your body mass index should be between 23-30. Your There is no height or weight on file to calculate BMI. If this is out of the aforementioned range listed, please consider follow up with your Primary Care Provider. ? ?If you are age 53 or younger, your body mass index should be between 19-25. Your There is no height or weight on file to calculate BMI. If this is out of the aformentioned range listed, please consider follow up with your Primary Care Provider.  ? ?We have sent the following medications to your pharmacy for you to pick up at your convenience: ? ?Dr Bryan Lemma has sent Cipro '500mg'$  take 1 twice daily for 14 days for the next time you have a flare. ? ? ? ? ?We want to thank you for trusting [facility] with your care. All of our staff and providers value the relationships we have built with our patients, and it is an honor to care for you.  ? ?We are writing to let you know that [facility] will close on Dec 10, 2021, and we invite you to continue to see Dr. Carmell Austria and Gerrit Heck at the [facility] [place] office location. We are consolidating our serices at these [facility] practices to better provide care. Our office staff will work with you to ensure a seamless transition.  ? ?Gerrit Heck, DO -Dr. Bryan Lemma will be movig to [facility] at [address], [place], [place] [address], effective Dec 10, 2021.  Contact [phone] to schedule an appointment with him.  ? ?Carmell Austria, MD- Dr. Lyndel Safe will be movig to [facility] at [address] [place], [place] [address], effective Dec 10, 2021.  Contact [phone] to schedule an appointment with him.  ? ?Requesting Medical Records ?If you need to request your medical records, please follow the instructions below. Your medical records are confidential, and a copy can be transferred to another provider or released to you or another  person you designate only with your permission. ? ?There are several ways to request your medical records: ?Requests for medical records can be submitted through our practice.   ?You can also request your records electronically, in your MyChart account by selecting the ?Request Health Records? tab.  ?If you need additional information on how to request records, please go to [email or web address]/, choose Patient Information, then select Request Medical Records. ?To make an appointment or if you have any questions about your health care needs, please contact our office at [phone] and one of our staff members will be glad to assist you. ?[facility] is committed to providing exceptional care for you and our community. Thank you for allowing [place] to serve your health care needs. ?Sincerely, ? ?Windy Canny, Director [facility] ?[facility] also offers convenient virtual care options. Sore throat? Sinus problems? Cold or flu symptoms? Get care from the comfort of home with [facility] Video Visits and e-Visits. Learn more about the non-emergency conditions treated and start your virtual visit at [email or web address]  ? ?Thank you for choosing me and [facility]. ? ?Gerrit Heck, D.O. ? ?

## 2021-11-22 NOTE — Progress Notes (Signed)
? ? ?          ?Chief Complaint: Pouchitis, Ulcerative Colitis ? ?GI Hx:   31 year old female with a history of Ulcerative Colitis s/p laparoscopic total colectomy with ileostomy in 2012 and subsequent reversal with J-pouch.  History pouchitis 1-2 times per year, typically resolved with ciprofloxacin/Flagyl.  History of iron deficiency anemia, previously treated with IV iron and PRBC transfusion 2012.  Not on any maintenance therapy. ? ?- 11/2015: Flexible sigmoidoscopy: Pouchitis.  Treated with Cipro/Flagyl ?- 05/2021: Hospital admission with pouchitis while [redacted] weeks pregnant.  Negative GI PCR panel.  CRP 9.9, calprotectin 603 ?- 06/04/2021: CT A/P: S/p colectomy and ileoanal anastomosis.  Moderate dilation of SB proximal to anastomosis s/o pSBO.  No inflammation of J-pouch or SB ?-06/05/2021: Flexible sigmoidoscopy: Mild, distal pouchitis.  Healthy-appearing anastomosis.  Normal-appearing small bowel.  Treated with Flagyl monotherapy due to pregnancy ? ? ?Family history notable for father with UC, paternal uncle with UC, diagnosed colon cancer.  Sister with Crohn Disease. ? ? ? ?HPI:   ? ?Due to current restrictions/limitations of in-office visits due to the COVID-19 pandemic, this scheduled clinical appointment was converted to a telehealth virtual consultation using MyChart video.  Due to connectivity issues after several attempts this was converted to a telephone call. ? ?-Time of medical discussion: 13 minutes ?-The patient did consent to this virtual visit and is aware of possible charges through their insurance for this visit.  ?-Names of all parties present: Brooke Mckee (patient), Gerrit Heck, DO, [facility] (physician) ?-Patient location: Home ?-Physician location: Office ? ?Brooke Mckee is a 31 y.o. female referred to the Gastroenterology Clinic for evaluation of recent flare of diarrhea and fecal urgency.  Was having up to 30 BM/day.  Was treated with ciprofloxacin monotherapy (due to breast-feeding  Flagyl was held). ? ?- 11/12/2021: Calprotectin 45, GI PCR panel with astrovirus detected.  Otherwise normal/negative ? ?She is now feeling much better.  Felt improvement within 24-48 hours of starting ABX.  Back to her usual state of health.  Tolerating all p.o. intake. ? ?Past medical history, past surgical history, social history, family history, medications, and allergies reviewed in the chart and with patient.   ? ?Past Medical History:  ?Diagnosis Date  ? Migraines   ? Pouchitis ([place])   ? Ulcerative colitis ([place])   ? ? ? ?Past Surgical History:  ?Procedure Laterality Date  ? BIOPSY  06/05/2021  ? Procedure: BIOPSY;  Surgeon: Lavena Bullion, DO;  Location: [facility];  Service: Gastroenterology;;  ? CESAREAN SECTION    ? CESAREAN SECTION N/A 07/11/2021  ? Procedure: CESAREAN SECTION;  Surgeon: Woodroe Mode, MD;  Location: [facility] LD ORS;  Service: Obstetrics;  Laterality: N/A;  ? COLON SURGERY    ? COLOPROCTECTOMY W/ ILEO J POUCH  2012  ? for UC  ? FLEXIBLE SIGMOIDOSCOPY N/A 06/05/2021  ? Procedure: FLEXIBLE SIGMOIDOSCOPY;  Surgeon: Lavena Bullion, DO;  Location: [facility];  Service: Gastroenterology;  Laterality: N/A;  unsedated procedure  ? TOTAL COLECTOMY  2012  ? with IPAA  ? ?Family History  ?Problem Relation Age of Onset  ? Colitis Father   ? ?Social History  ? ?Tobacco Use  ? Smoking status: Never  ? Smokeless tobacco: Never  ?Vaping Use  ? Vaping Use: Never used  ?Substance Use Topics  ? Alcohol use: Never  ? Drug use: Never  ? ?Current Outpatient Medications  ?Medication Sig Dispense Refill  ? acetaminophen (TYLENOL) 500 MG tablet Take 2  tablets (1,000 mg total) by mouth every 6 (six) hours as needed. (Patient not taking: Reported on 07/19/2021) 30 tablet 0  ? albuterol (VENTOLIN HFA) 108 (90 Base) MCG/ACT inhaler Inhale 2 puffs into the lungs every 6 (six) hours as needed for wheezing or shortness of breath.    ? ciprofloxacin (CIPRO) 500 MG tablet Take 1 tablet (500 mg total) by mouth every  12 (twelve) hours. 28 tablet 0  ? Ferrous Sulfate (IRON) 142 (45 Fe) MG TBCR Take 142 mg by mouth daily.    ? ibuprofen (ADVIL) 600 MG tablet Take 1 tablet (600 mg total) by mouth every 6 (six) hours as needed. 60 tablet 0  ? norethindrone (ORTHO MICRONOR) 0.35 MG tablet Take 1 tablet (0.35 mg total) by mouth daily. 84 tablet 3  ? Prenatal Vit-Fe Fumarate-FA (PRENATAL VITAMINS PO) Take 1 tablet by mouth daily.    ? sertraline (ZOLOFT) 25 MG tablet Take 1 tablet (25 mg total) by mouth daily. 30 tablet 5  ? sertraline (ZOLOFT) 25 MG tablet Take 2 tablets (50 mg total) by mouth daily. 180 tablet 1  ? ?No current facility-administered medications for this visit.  ? ?Allergies  ?Allergen Reactions  ? Morphine Hives  ?  All over body. Pt states she hast tolerated hydromorphone in the past  ? ? ? ?Review of Systems: ?All systems reviewed and negative except where noted in HPI.  ? ? ? ?Physical Exam:   ? ?Complete physical exam not completed due to the nature of this telehealth communication.   ?Gen: Awake, alert, and oriented, and well communicative. ?Psych: Pleasant, cooperative, normal speech, thought processing seemingly intact ? ? ?ASSESSMENT AND PLAN;  ? ?1) Ulcerative Colitis s/p colectomy with IPAA ?2) Pouchitis ? ?-Symptoms resolved with most recent course of ciprofloxacin monotherapy ?- Provided with Rx for ciprofloxacin 500 mg PO BID x14 days that she can fill/take if symptom recurrence.  To call us if having to fill that Rx ?- If recurrent episode of pouchitis, again check a GI PCR panel and fecal calprotectin at onset of symptoms ? ?- We did discuss the possibility of escalation of care.  This is typically for patients to either do not respond to antibiotics or having continued episodes and more frequent episodes.  Options would include prolonged Cipro/Flagyl course, using rifaximin.  Inpatients with more frequent episodes (>3 per year), can consider starting maintenance therapy with VSL #3, chronic rifaximin,  or mesalamine (PO and PR) or topical steroid. In patients with refractory, chronic pouchitis, newer data support the use of Entyvio in patients who are not responsive to the above therapies. ? ?- Flexible sigmoidoscopy in 2 years for surveillance or sooner if needed ?- RTC in 1 year or sooner as needed ? ? ?[place], DO, FACG  11/22/2021, 1:19 PM ? ? ?No ref. provider found ? ?

## 2022-01-02 ENCOUNTER — Other Ambulatory Visit (HOSPITAL_BASED_OUTPATIENT_CLINIC_OR_DEPARTMENT_OTHER): Payer: Self-pay

## 2022-03-26 ENCOUNTER — Other Ambulatory Visit (HOSPITAL_BASED_OUTPATIENT_CLINIC_OR_DEPARTMENT_OTHER): Payer: Self-pay

## 2022-04-08 ENCOUNTER — Encounter: Payer: Self-pay | Admitting: Plastic Surgery

## 2022-04-08 ENCOUNTER — Ambulatory Visit (INDEPENDENT_AMBULATORY_CARE_PROVIDER_SITE_OTHER): Payer: Self-pay | Admitting: Plastic Surgery

## 2022-04-08 DIAGNOSIS — Z719 Counseling, unspecified: Secondary | ICD-10-CM

## 2022-04-08 NOTE — Progress Notes (Signed)

## 2022-04-30 ENCOUNTER — Other Ambulatory Visit (HOSPITAL_BASED_OUTPATIENT_CLINIC_OR_DEPARTMENT_OTHER): Payer: Self-pay

## 2022-04-30 MED ORDER — INFLUENZA VAC SPLIT QUAD 0.5 ML IM SUSY
PREFILLED_SYRINGE | INTRAMUSCULAR | 0 refills | Status: DC
  Filled 2022-04-30: qty 0.5, 1d supply, fill #0

## 2022-05-30 ENCOUNTER — Ambulatory Visit (INDEPENDENT_AMBULATORY_CARE_PROVIDER_SITE_OTHER): Payer: 59

## 2022-05-30 ENCOUNTER — Other Ambulatory Visit: Payer: Self-pay

## 2022-05-30 ENCOUNTER — Other Ambulatory Visit (HOSPITAL_COMMUNITY)
Admission: RE | Admit: 2022-05-30 | Discharge: 2022-05-30 | Disposition: A | Discharge status: Home / Self Care | Payer: 59 | Source: Ambulatory Visit | Attending: Family Medicine | Admitting: Family Medicine

## 2022-05-30 ENCOUNTER — Other Ambulatory Visit (HOSPITAL_BASED_OUTPATIENT_CLINIC_OR_DEPARTMENT_OTHER): Payer: Self-pay

## 2022-05-30 VITALS — BP 117/80 | HR 93

## 2022-05-30 DIAGNOSIS — N760 Acute vaginitis: Secondary | ICD-10-CM | POA: Insufficient documentation

## 2022-05-30 DIAGNOSIS — B9689 Other specified bacterial agents as the cause of diseases classified elsewhere: Secondary | ICD-10-CM | POA: Diagnosis not present

## 2022-05-30 MED ORDER — METRONIDAZOLE 500 MG PO TABS
500.0000 mg | ORAL_TABLET | Freq: Two times a day (BID) | ORAL | 0 refills | Status: DC
  Filled 2022-05-30: qty 14, 7d supply, fill #0

## 2022-05-30 NOTE — Progress Notes (Signed)
Pt presents today for self swab. Pt educated in how to collect self swab. Swab obtained. Pt has complaints of vaginal discharge with fishy odor. Flagyl sent to pharmacy d/t BV symptoms. Pt informed that results would come back via MyChart in 24-48 hours. Pt verbalized understanding and denied further questions.

## 2022-05-31 LAB — CERVICOVAGINAL ANCILLARY ONLY
Bacterial Vaginitis (gardnerella): NEGATIVE
Candida Glabrata: NEGATIVE
Candida Vaginitis: NEGATIVE
Comment: NEGATIVE
Comment: NEGATIVE
Comment: NEGATIVE

## 2022-06-05 DIAGNOSIS — D225 Melanocytic nevi of trunk: Secondary | ICD-10-CM | POA: Diagnosis not present

## 2022-06-05 DIAGNOSIS — L814 Other melanin hyperpigmentation: Secondary | ICD-10-CM | POA: Diagnosis not present

## 2022-06-05 DIAGNOSIS — D2261 Melanocytic nevi of right upper limb, including shoulder: Secondary | ICD-10-CM | POA: Diagnosis not present

## 2022-06-05 DIAGNOSIS — D2262 Melanocytic nevi of left upper limb, including shoulder: Secondary | ICD-10-CM | POA: Diagnosis not present

## 2022-07-19 ENCOUNTER — Ambulatory Visit: Payer: 59 | Admitting: Obstetrics and Gynecology

## 2022-08-13 ENCOUNTER — Other Ambulatory Visit (HOSPITAL_BASED_OUTPATIENT_CLINIC_OR_DEPARTMENT_OTHER): Payer: Self-pay

## 2022-08-13 ENCOUNTER — Encounter (HOSPITAL_BASED_OUTPATIENT_CLINIC_OR_DEPARTMENT_OTHER): Payer: Self-pay | Admitting: Obstetrics & Gynecology

## 2022-08-13 ENCOUNTER — Ambulatory Visit (HOSPITAL_BASED_OUTPATIENT_CLINIC_OR_DEPARTMENT_OTHER): Payer: Commercial Managed Care - PPO | Admitting: Obstetrics & Gynecology

## 2022-08-13 VITALS — BP 117/74 | HR 64 | Ht 67.0 in | Wt 129.2 lb

## 2022-08-13 DIAGNOSIS — N952 Postmenopausal atrophic vaginitis: Secondary | ICD-10-CM

## 2022-08-13 DIAGNOSIS — N9089 Other specified noninflammatory disorders of vulva and perineum: Secondary | ICD-10-CM

## 2022-08-13 DIAGNOSIS — Z30430 Encounter for insertion of intrauterine contraceptive device: Secondary | ICD-10-CM | POA: Diagnosis not present

## 2022-08-13 MED ORDER — LEVONORGESTREL 20 MCG/DAY IU IUD
1.0000 | INTRAUTERINE_SYSTEM | Freq: Once | INTRAUTERINE | Status: AC
  Administered 2022-08-13: 1 via INTRAUTERINE

## 2022-08-13 MED ORDER — ESTRADIOL 0.1 MG/GM VA CREA
1.0000 g | TOPICAL_CREAM | Freq: Two times a day (BID) | VAGINAL | 1 refills | Status: DC
  Filled 2022-08-13: qty 42.5, 20d supply, fill #0

## 2022-08-13 NOTE — Progress Notes (Signed)
GYNECOLOGY  VISIT  CC:   change contraception  HPI: 32 y.o. [id number] Married White or Caucasian female here for IUD placement.  Delivered last child 07/11/2021 by cesarean section.  Was placed on micronor as was breast feeding.  She does not have cycles on this.  Ready to transition to longer acting contraception.  Not currently planning another pregnancy.  Has a master's degree in opera.    Last pap done 01/16/2021 and this was neg with neg HR HPV.  Reviewed in care everywhere.    Does have some discomfort with intercourse.  Has been present during breast feeding.  Vaginal estradiol discussed for treatment.  Administration, risks reviewed.     Past Medical History:  Diagnosis Date   Migraines    Pouchitis ([place])    Ulcerative colitis ([place])     MEDS:   Current Outpatient Medications on File Prior to Visit  Medication Sig Dispense Refill   influenza vac split quadrivalent PF (FLUARIX) 0.5 ML injection Inject into the muscle. 0.5 mL 0   No current facility-administered medications on file prior to visit.    ALLERGIES: Morphine  SH:  married, non smoker  Review of Systems  Constitutional: Negative.   Genitourinary:        Dyspareunia    PHYSICAL EXAMINATION:    BP 117/74 (BP Location: Right Arm, Patient Position: Sitting, Cuff Size: Large)   Pulse 64   Ht '5\' 7"'$  (1.702 m) Comment: reported  Wt 129 lb 3.2 oz (58.6 kg)   BMI 20.24 kg/m     General appearance: alert, cooperative and appears stated age  Lymph:  no inguinal LAD noted  Pelvic: External genitalia:  no lesions              Urethra:  normal appearing urethra with no masses, tenderness or lesions              Bartholins and Skenes: normal                 Vagina: atrophic appearance without lesions, blood or discharge              Cervix: no lesions              Bimanual Exam:  Uterus:  normal size, contour, position, consistency, mobility, non-tender              Adnexa: no mass, fullness, tenderness               Procedure:  Consent obtained for IUD placement.  Risks and benefits reviewed.  Speculum placed.  Cervix cleansed with betadine x 3.  Single toothed tenaculum applied to anterior lip of cervix.  Uterus sounded to 8cm.  Mirena IUD and introducer passed through cervix to fundus.  Introducer then slightly withdrawn and IUD deployed.  Introducer removed and strings cut to 2cm.  Tenaculum removed.  Pt tolerated procedure well.  Chaperone, Ezekiel Ina, RN, was present for exam.  Assessment/Plan: 1. Encounter for insertion of mirena IUD - recheck 6-8 weeks unless problems/concerns and then would seen sooner - levonorgestrel (MIRENA) 20 MCG/DAY IUD 1 each  2. Vaginal atrophy - estradiol (ESTRACE) 0.1 MG/GM vaginal cream; Place 1 gram vaginally 2 (two) times daily.  Dispense: 42.5 g; Refill: 1

## 2022-08-20 ENCOUNTER — Ambulatory Visit: Payer: 59 | Admitting: Obstetrics and Gynecology

## 2022-09-16 ENCOUNTER — Ambulatory Visit (INDEPENDENT_AMBULATORY_CARE_PROVIDER_SITE_OTHER): Payer: Self-pay | Admitting: Surgical

## 2022-09-16 DIAGNOSIS — Z719 Counseling, unspecified: Secondary | ICD-10-CM

## 2022-09-16 NOTE — Progress Notes (Signed)
Botulinum Toxin Procedure Note  Procedure: Cosmetic botulinum toxin  Pre-operative Diagnosis: Dynamic rhytides  Post-operative Diagnosis: Same  Complications:  None  Brief history: The patient desires botulinum toxin injection.  She is aware of the risks including bleeding, damage to deeper structures, asymmetry, brow ptosis, eyelid ptosis, bruising. The patient understands and wishes to proceed.  Procedure: The area was prepped with alcohol and dried with a clean gauze.  Using a clean technique the botulinum toxin was diluted with 2.5 mL of bacteriostatic saline per 100 unit vial which resulted in 4 units per 0.1 mL.  Subsequently the mixture was injected in the glabellar, lateral canthal lines, forehead area with preservation of the temporal branch to the lateral eyebrow. A total of 30 Units of botulinum toxin was used. The forehead and glabellar area was injected with care to inject intramuscular only while holding pressure on the supratrochlear vessels in each area during each injection on either side of the medial corrugators. The injection proceeded vertically superiorly to the medial 2/3 of the frontalis muscle and superior 2/3 of the lateral frontalis, again with preservation of the frontal branch.  We injected the forehead in a standard 2 line pattern We injected the lateral canthal lines at one injection point just lateral to the lateral canthus.  All injections were 2 units  No complications were noted. Light pressure was held for 5 minutes. She was instructed explicitly in post-operative care.  Botox LOT:  [id number] EXP:  08/2024

## 2022-10-01 ENCOUNTER — Ambulatory Visit (HOSPITAL_BASED_OUTPATIENT_CLINIC_OR_DEPARTMENT_OTHER): Payer: Commercial Managed Care - PPO | Admitting: Advanced Practice Midwife

## 2022-10-01 ENCOUNTER — Encounter (HOSPITAL_BASED_OUTPATIENT_CLINIC_OR_DEPARTMENT_OTHER): Payer: Self-pay | Admitting: Advanced Practice Midwife

## 2022-10-01 VITALS — BP 118/88 | HR 66 | Ht 67.0 in | Wt 127.0 lb

## 2022-10-01 DIAGNOSIS — Z30431 Encounter for routine checking of intrauterine contraceptive device: Secondary | ICD-10-CM

## 2022-10-01 DIAGNOSIS — Z975 Presence of (intrauterine) contraceptive device: Secondary | ICD-10-CM | POA: Insufficient documentation

## 2022-10-01 NOTE — Progress Notes (Signed)
   GYNECOLOGY CLINIC PROGRESS NOTE  History:  32 y.o. [id number] here at [facility] today for today for IUD string check; Mirena IUD was placed  08/13/22. No complaints about the IUD, no concerning side effects.  The following portions of the patient's history were reviewed and updated as appropriate: allergies, current medications, past family history, past medical history, past social history, past surgical history and problem list.  Review of Systems:  Pertinent items are noted in HPI.   Objective:  Physical Exam Blood pressure 118/88, pulse 66, height '5\' 7"'$  (1.702 m), weight 127 lb (57.6 kg), not currently breastfeeding. Gen: NAD Abd: Soft, nontender and nondistended Pelvic: Normal appearing external genitalia; normal appearing vaginal mucosa and cervix.  IUD strings not visualized  Bedside US performed and IUD seen in appropriate location.  Assessment & Plan:  Normal IUD check. Patient to keep IUD in place for 7-8 years; can come in for removal if she desires pregnancy within the next 7-8years. Routine preventative health maintenance measures emphasized.    Fatima Blank, CNM 9:58 AM

## 2022-10-08 ENCOUNTER — Encounter: Payer: Self-pay | Admitting: Gastroenterology

## 2022-10-08 ENCOUNTER — Telehealth: Payer: Self-pay | Admitting: Gastroenterology

## 2022-10-08 DIAGNOSIS — K91850 Pouchitis: Secondary | ICD-10-CM

## 2022-10-08 DIAGNOSIS — R197 Diarrhea, unspecified: Secondary | ICD-10-CM

## 2022-10-08 DIAGNOSIS — K51919 Ulcerative colitis, unspecified with unspecified complications: Secondary | ICD-10-CM

## 2022-10-08 NOTE — Telephone Encounter (Signed)
Inbound call from patient requesting a call back from a nurse .Marland KitchenMarland KitchenPlease advise

## 2022-10-09 MED ORDER — METRONIDAZOLE 500 MG PO TABS: 500.0000 mg | ORAL_TABLET | Freq: Three times a day (TID) | ORAL | 0 refills | Status: AC

## 2022-10-09 MED ORDER — CIPROFLOXACIN HCL 500 MG PO TABS: 500.0000 mg | ORAL_TABLET | Freq: Two times a day (BID) | ORAL | 0 refills | Status: AC

## 2022-10-09 NOTE — Addendum Note (Signed)
Addended by: Timothy Lasso on: 10/09/2022 12:25 PM   Modules accepted: Orders

## 2022-10-10 NOTE — Telephone Encounter (Signed)
This has been addressed. See 3/12 patient message for details.

## 2022-12-16 ENCOUNTER — Ambulatory Visit (INDEPENDENT_AMBULATORY_CARE_PROVIDER_SITE_OTHER): Payer: Self-pay | Admitting: Plastic Surgery

## 2022-12-16 ENCOUNTER — Encounter: Payer: Self-pay | Admitting: Plastic Surgery

## 2022-12-16 DIAGNOSIS — Z719 Counseling, unspecified: Secondary | ICD-10-CM | POA: Insufficient documentation

## 2022-12-16 NOTE — Progress Notes (Signed)
Botulinum Toxin Procedure Note  Procedure: Cosmetic botulinum toxin   Pre-operative Diagnosis: Dynamic rhytides   Post-operative Diagnosis: Same  Complications:  None  Brief history: The patient desires botulinum toxin injection of her forehead. I discussed with the patient this proposed procedure of botulinum toxin injections, which is customized depending on the particular needs of the patient. It is performed on facial rhytids as a temporary correction. The alternatives were discussed with the patient. The risks were addressed including bleeding, scarring, infection, damage to deeper structures, asymmetry, and chronic pain, which may occur infrequently after a procedure. The individual's choice to undergo a surgical procedure is based on the comparison of risks to potential benefits. Other risks include unsatisfactory results, brow ptosis, eyelid ptosis, allergic reaction, temporary paralysis, which should go away with time, bruising, blurring disturbances and delayed healing. Botulinum toxin injections do not arrest the aging process or produce permanent tightening of the eyelid.  Operative intervention maybe necessary to maintain the results of a blepharoplasty or botulinum toxin. The patient understands and wishes to proceed.  Procedure: The area was prepped with alcohol and dried with a clean gauze. Using a clean technique, the botulinum toxin was diluted with 1.25 cc of preservative-free normal saline which was slowly injected with an 18 gauge needle in a tuberculin syringes.  A 32 gauge needles were then used to inject the botulinum toxin. This mixture allow for an aliquot of 4 units per 0.1 cc in each injection site.    Subsequently the mixture was injected in the glabellar and forehead area with preservation of the temporal branch to the lateral eyebrow as well as into each lateral canthal area beginning from the lateral orbital rim medial to the zygomaticus major in 3 separate areas. A  total of 32 Units of botulinum toxin was used. The forehead and glabellar area was injected with care to inject intramuscular only while holding pressure on the supratrochlear vessels in each area during each injection on either side of the medial corrugators. The injection proceeded vertically superiorly to the medial 2/3 of the frontalis muscle and superior 2/3 of the lateral frontalis, again with preservation of the frontal branch. No complications were noted. Light pressure was held for 5 minutes. She was instructed explicitly in post-operative care.  Botox LOT:  [id number]  

## 2023-04-22 ENCOUNTER — Encounter: Payer: Self-pay | Admitting: Plastic Surgery

## 2023-04-22 ENCOUNTER — Ambulatory Visit (INDEPENDENT_AMBULATORY_CARE_PROVIDER_SITE_OTHER): Payer: Commercial Managed Care - PPO | Admitting: Plastic Surgery

## 2023-04-22 ENCOUNTER — Ambulatory Visit (INDEPENDENT_AMBULATORY_CARE_PROVIDER_SITE_OTHER): Payer: Self-pay | Admitting: Plastic Surgery

## 2023-04-22 DIAGNOSIS — K660 Peritoneal adhesions (postprocedural) (postinfection): Secondary | ICD-10-CM | POA: Diagnosis not present

## 2023-04-22 DIAGNOSIS — Z719 Counseling, unspecified: Secondary | ICD-10-CM

## 2023-04-22 NOTE — Progress Notes (Signed)

## 2023-05-02 ENCOUNTER — Encounter: Payer: Self-pay | Admitting: Plastic Surgery

## 2023-05-02 DIAGNOSIS — K660 Peritoneal adhesions (postprocedural) (postinfection): Secondary | ICD-10-CM | POA: Insufficient documentation

## 2023-05-02 NOTE — Progress Notes (Signed)
Patient ID: Brooke Mckee, female    DOB: 12-24-1990, 32 y.o.   MRN: [id number]   Chief Complaint  Patient presents with   Skin Problem    The patient is a 32 year old female here for evaluation of her abdomen.  The patient had abdominal surgery for ulcerative colitis.  She had pouchitis as well and now has an adhesion of her abdominal wall.  It is very uncomfortable and it seems to be getting worse with time.  Pictures will show its location.  The area is about 4 cm in size.  She feels a pulling on it especially when she is really active.  She is a very active person with exercise as well.  It feels like her abdominal wall is adherent to her soft tissue.  Do not feel a hernia.    Review of Systems  Constitutional: Negative.   HENT: Negative.    Eyes: Negative.   Respiratory: Negative.  Negative for chest tightness.   Cardiovascular: Negative.   Gastrointestinal: Negative.   Endocrine: Negative.   Genitourinary: Negative.   Musculoskeletal: Negative.     Past Medical History:  Diagnosis Date   Migraines    Pouchitis ([facility])    Ulcerative colitis [facility])     Past Surgical History:  Procedure Laterality Date   BIOPSY  06/05/2021   Procedure: BIOPSY;  Surgeon: Shellia Cleverly, DO;  Location: [facility];  Service: Gastroenterology;;   CESAREAN SECTION     CESAREAN SECTION N/A 07/11/2021   Procedure: CESAREAN SECTION;  Surgeon: Adam Phenix, MD;  Location: [facility] LD ORS;  Service: Obstetrics;  Laterality: N/A;   FLEXIBLE SIGMOIDOSCOPY N/A 06/05/2021   Procedure: FLEXIBLE SIGMOIDOSCOPY;  Surgeon: Shellia Cleverly, DO;  Location: [facility];  Service: Gastroenterology;  Laterality: N/A;  unsedated procedure   ILEAL POUCH  2012   [place]   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 3 months)      Current Outpatient Medications:    estradiol (ESTRACE) 0.1 MG/GM vaginal cream, Place 1 gram vaginally 2 (two) times daily., Disp: 42.5 g, Rfl: 1   influenza vac split  quadrivalent PF (FLUARIX) 0.5 ML injection, Inject into the muscle., Disp: 0.5 mL, Rfl: 0   levonorgestrel (MIRENA) 20 MCG/DAY IUD, 1 each by Intrauterine route once., Disp: , Rfl:    Objective:   There were no vitals filed for this visit.  Physical Exam Vitals reviewed.  Constitutional:      Appearance: Normal appearance.  HENT:     Head: Atraumatic.  Cardiovascular:     Rate and Rhythm: Normal rate.     Pulses: Normal pulses.  Pulmonary:     Effort: Pulmonary effort is normal.  Abdominal:     Palpations: Abdomen is soft.     Tenderness: There is abdominal tenderness.  Skin:    General: Skin is warm.     Coloration: Skin is not jaundiced.     Findings: No bruising.  Neurological:     Mental Status: She is alert and oriented to person, place, and time.  Psychiatric:        Mood and Affect: Mood normal.        Behavior: Behavior normal.        Thought Content: Thought content normal.        Judgment: Judgment normal.     Assessment & Plan:  Adhesion of abdominal wall  Plan for excision of adhesion of abdominal wall with release of 4 to  6 cm.  May need myriad to help prevent a recurrence.  Pictures were obtained of the patient and placed in the chart with the patient's or guardian's permission.   Alena Bills Graycen Degan, DO

## 2023-06-17 ENCOUNTER — Ambulatory Visit (INDEPENDENT_AMBULATORY_CARE_PROVIDER_SITE_OTHER): Payer: Commercial Managed Care - PPO | Admitting: Physician Assistant

## 2023-06-17 ENCOUNTER — Encounter: Payer: Self-pay | Admitting: Physician Assistant

## 2023-06-17 ENCOUNTER — Other Ambulatory Visit (HOSPITAL_BASED_OUTPATIENT_CLINIC_OR_DEPARTMENT_OTHER): Payer: Self-pay

## 2023-06-17 VITALS — BP 105/64 | HR 75 | Ht 67.0 in | Wt 127.0 lb

## 2023-06-17 DIAGNOSIS — K660 Peritoneal adhesions (postprocedural) (postinfection): Secondary | ICD-10-CM

## 2023-06-17 MED ORDER — TRAMADOL HCL 50 MG PO TABS
50.0000 mg | ORAL_TABLET | Freq: Three times a day (TID) | ORAL | 0 refills | Status: AC | PRN
  Filled 2023-06-17: qty 6, 2d supply, fill #0

## 2023-06-17 MED ORDER — ONDANSETRON 4 MG PO TBDP
4.0000 mg | ORAL_TABLET | Freq: Three times a day (TID) | ORAL | 0 refills | Status: DC | PRN
  Filled 2023-06-17: qty 20, 7d supply, fill #0

## 2023-06-17 MED ORDER — CEPHALEXIN 500 MG PO CAPS
500.0000 mg | ORAL_CAPSULE | Freq: Four times a day (QID) | ORAL | 0 refills | Status: AC
  Filled 2023-06-17: qty 12, 3d supply, fill #0

## 2023-06-17 NOTE — H&P (View-Only) (Signed)
 z    Patient ID: Brooke Mckee, female    DOB: 1990/12/31, 32 y.o.   MRN: [id number]  Chief Complaint  Patient presents with   Pre-op Exam      ICD-10-CM   1. Adhesion of abdominal wall  K66.0        History of Present Illness: Brooke Mckee is a 32 y.o.  female  with a history of ulcerative colitis s/p laparoscopic total colectomy with ileostomy in 2012 with reversal J-pouch and subsequent recurrent pouchitis.  She presents for preoperative evaluation for upcoming procedure, scar revision for abdominal adhesions, scheduled for 07/09/2023 with Dr. Ulice Bold.  The patient has not had problems with anesthesia aside from ileus.  Patient plans to drink plenty of water and ambulate shortly after surgery to help move her bowels.  She is unable to take certain laxative medications in context of her ulcerative colitis.  She denies any personal or family history of blood clots or clotting disorder.  She denies any personal history of cancer, varicosities, severe cardiac or pulmonary disease, or nicotine use.  She took heavy dose steroids for treatment of her ulcerative colitis for years prior to her surgeries in 2012.  This was the cause for her noted osteoporosis as well as risk factor for her hip fracture she sustained.  However, she is no longer requiring any medication for her ulcerative colitis and exercises regularly with good bone health.  Discussed expectations from surgery and patient is agreeable to proceed.  Advised against any lifting, pushing, or pulling greater than 10 to 15 pounds x 2 weeks postoperatively which may be difficult given that she takes care of her two young children.  Summary of Previous Visit: She was seen for consult Dr. Ulice Bold on 04/22/2023.  At that time, she was bothered by uncomfortable adhesion on abdominal wall, noted to be approximately 4 cm in size.  Felt as though it was pulling, particularly during exercise and she is an active person.  No palpable hernia.   Discussed excision of the adhesion and scar release.  Possible myriad placement to help prevent recurrence.  Pictures were obtained and placed in chart.  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  PMH Significant for: Ulcerative colitis with history of laparoscopic total colectomy and ileostomy 2012 with reversal J-pouch and subsequent recurrent pouchitis, history of C-section, partial SBO, hip fracture.     Past Medical History: Allergies: Allergies  Allergen Reactions   Morphine Hives    All over body. Pt states she hast tolerated hydromorphone in the past    Current Medications:  Current Outpatient Medications:    influenza vac split quadrivalent PF (FLUARIX) 0.5 ML injection, Inject into the muscle., Disp: 0.5 mL, Rfl: 0   levonorgestrel (MIRENA) 20 MCG/DAY IUD, 1 each by Intrauterine route once., Disp: , Rfl:    estradiol (ESTRACE) 0.1 MG/GM vaginal cream, Place 1 gram vaginally 2 (two) times daily., Disp: 42.5 g, Rfl: 1  Past Medical Problems: Past Medical History:  Diagnosis Date   Migraines    Pouchitis (HCC)    Ulcerative colitis (HCC)     Past Surgical History: Past Surgical History:  Procedure Laterality Date   BIOPSY  06/05/2021   Procedure: BIOPSY;  Surgeon: Shellia Cleverly, DO;  Location: [facility];  Service: Gastroenterology;;   CESAREAN SECTION     CESAREAN SECTION N/A 07/11/2021   Procedure: CESAREAN SECTION;  Surgeon: Adam Phenix, MD;  Location: [facility];  Service: Obstetrics;  Laterality: N/A;   FLEXIBLE SIGMOIDOSCOPY N/A 06/05/2021   Procedure: FLEXIBLE SIGMOIDOSCOPY;  Surgeon: Shellia Cleverly, DO;  Location: [facility];  Service: Gastroenterology;  Laterality: N/A;  unsedated procedure   ILEAL POUCH  2012   [place]   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 3 months)    Social History: Social History   Socioeconomic History   Marital status: Married    Spouse name: Not on file   Number of  children: Not on file   Years of education: Not on file   Highest education level: Not on file  Occupational History   Not on file  Tobacco Use   Smoking status: Never   Smokeless tobacco: Never  Vaping Use   Vaping status: Never Used  Substance and Sexual Activity   Alcohol use: Never   Drug use: Never   Sexual activity: Yes  Other Topics Concern   Not on file  Social History Narrative   Not on file   Social Determinants of Health   Financial Resource Strain: Not on file  Food Insecurity: No Food Insecurity (07/19/2021)   Hunger Vital Sign    Worried About Running Out of Food in the Last Year: Never true    Ran Out of Food in the Last Year: Never true  Transportation Needs: No Transportation Needs (07/19/2021)   PRAPARE - Administrator, Civil Service (Medical): No    Lack of Transportation (Non-Medical): No  Physical Activity: Not on file  Stress: Not on file  Social Connections: Not on file  Intimate Partner Violence: Not on file    Family History: Family History  Problem Relation Age of Onset   Colitis Father     Review of Systems: ROS Denies any recent chest pain, difficulty breathing, leg swelling, fevers.  Physical Exam: Vital Signs BP 105/64 (BP Location: Left Arm, Patient Position: Sitting, Cuff Size: Small)   Pulse 75   Ht 5\' 7"  (1.702 m)   Wt 127 lb (57.6 kg)   SpO2 97%   BMI 19.89 kg/m   Physical Exam Constitutional:      General: Not in acute distress.    Appearance: Normal appearance. Not ill-appearing.  HENT:     Head: Normocephalic and atraumatic.  Eyes:     Pupils: Pupils are equal, round. Cardiovascular:     Rate and Rhythm: Normal rate.    Pulses: Normal pulses.  Pulmonary:     Effort: No respiratory distress or increased work of breathing.  Speaks in full sentences. Abdominal:     General: Abdomen is flat.   Musculoskeletal: Normal range of motion. No lower extremity swelling or edema. No varicosities. Skin:     General: Skin is warm and dry.     Findings: No erythema or rash.  Neurological:     Mental Status: Alert and oriented to person, place, and time.  Psychiatric:        Mood and Affect: Mood normal.        Behavior: Behavior normal.    Assessment/Plan: The patient is scheduled for scar revision for abdominal adhesions related to previous surgery with Dr. Ulice Bold.  Risks, benefits, and alternatives of procedure discussed, questions answered and consent obtained.    Smoking Status: Non-smoker.  Caprini Score: 3; Risk Factors include: History of ulcerative colitis and length of planned surgery. Recommendation for mechanical prophylaxis. Encourage early ambulation.   Pictures obtained: 04/22/2023  Post-op Rx sent to pharmacy: Tramadol, Keflex, Zofran.  Patient  was provided with the General Surgical Risk consent document and Pain Medication Agreement prior to their appointment.  They had adequate time to read through the risk consent documents and Pain Medication Agreement. We also discussed them in person together during this preop appointment. All of their questions were answered to their satisfaction.  Recommended calling if they have any further questions.  Risk consent form and Pain Medication Agreement to be scanned into patient's chart.    Electronically signed by: Evelena Leyden, PA-C 06/17/2023 11:15 AM

## 2023-06-17 NOTE — Progress Notes (Signed)
z    Patient ID: Brooke Mckee, female    DOB: 1990/12/31, 32 y.o.   MRN: [id number]  Chief Complaint  Patient presents with   Pre-op Exam      ICD-10-CM   1. Adhesion of abdominal wall  K66.0        History of Present Illness: Brooke Mckee is a 32 y.o.  female  with a history of ulcerative colitis s/p laparoscopic total colectomy with ileostomy in 2012 with reversal J-pouch and subsequent recurrent pouchitis.  She presents for preoperative evaluation for upcoming procedure, scar revision for abdominal adhesions, scheduled for 07/09/2023 with Dr. Ulice Bold.  The patient has not had problems with anesthesia aside from ileus.  Patient plans to drink plenty of water and ambulate shortly after surgery to help move her bowels.  She is unable to take certain laxative medications in context of her ulcerative colitis.  She denies any personal or family history of blood clots or clotting disorder.  She denies any personal history of cancer, varicosities, severe cardiac or pulmonary disease, or nicotine use.  She took heavy dose steroids for treatment of her ulcerative colitis for years prior to her surgeries in 2012.  This was the cause for her noted osteoporosis as well as risk factor for her hip fracture she sustained.  However, she is no longer requiring any medication for her ulcerative colitis and exercises regularly with good bone health.  Discussed expectations from surgery and patient is agreeable to proceed.  Advised against any lifting, pushing, or pulling greater than 10 to 15 pounds x 2 weeks postoperatively which may be difficult given that she takes care of her two young children.  Summary of Previous Visit: She was seen for consult Dr. Ulice Bold on 04/22/2023.  At that time, she was bothered by uncomfortable adhesion on abdominal wall, noted to be approximately 4 cm in size.  Felt as though it was pulling, particularly during exercise and she is an active person.  No palpable hernia.   Discussed excision of the adhesion and scar release.  Possible myriad placement to help prevent recurrence.  Pictures were obtained and placed in chart.  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  PMH Significant for: Ulcerative colitis with history of laparoscopic total colectomy and ileostomy 2012 with reversal J-pouch and subsequent recurrent pouchitis, history of C-section, partial SBO, hip fracture.     Past Medical History: Allergies: Allergies  Allergen Reactions   Morphine Hives    All over body. Pt states she hast tolerated hydromorphone in the past    Current Medications:  Current Outpatient Medications:    influenza vac split quadrivalent PF (FLUARIX) 0.5 ML injection, Inject into the muscle., Disp: 0.5 mL, Rfl: 0   levonorgestrel (MIRENA) 20 MCG/DAY IUD, 1 each by Intrauterine route once., Disp: , Rfl:    estradiol (ESTRACE) 0.1 MG/GM vaginal cream, Place 1 gram vaginally 2 (two) times daily., Disp: 42.5 g, Rfl: 1  Past Medical Problems: Past Medical History:  Diagnosis Date   Migraines    Pouchitis (HCC)    Ulcerative colitis (HCC)     Past Surgical History: Past Surgical History:  Procedure Laterality Date   BIOPSY  06/05/2021   Procedure: BIOPSY;  Surgeon: Shellia Cleverly, DO;  Location: [facility];  Service: Gastroenterology;;   CESAREAN SECTION     CESAREAN SECTION N/A 07/11/2021   Procedure: CESAREAN SECTION;  Surgeon: Adam Phenix, MD;  Location: [facility];  Service: Obstetrics;  Laterality: N/A;   FLEXIBLE SIGMOIDOSCOPY N/A 06/05/2021   Procedure: FLEXIBLE SIGMOIDOSCOPY;  Surgeon: Shellia Cleverly, DO;  Location: [facility];  Service: Gastroenterology;  Laterality: N/A;  unsedated procedure   ILEAL POUCH  2012   [place]   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 3 months)    Social History: Social History   Socioeconomic History   Marital status: Married    Spouse name: Not on file   Number of  children: Not on file   Years of education: Not on file   Highest education level: Not on file  Occupational History   Not on file  Tobacco Use   Smoking status: Never   Smokeless tobacco: Never  Vaping Use   Vaping status: Never Used  Substance and Sexual Activity   Alcohol use: Never   Drug use: Never   Sexual activity: Yes  Other Topics Concern   Not on file  Social History Narrative   Not on file   Social Determinants of Health   Financial Resource Strain: Not on file  Food Insecurity: No Food Insecurity (07/19/2021)   Hunger Vital Sign    Worried About Running Out of Food in the Last Year: Never true    Ran Out of Food in the Last Year: Never true  Transportation Needs: No Transportation Needs (07/19/2021)   PRAPARE - Administrator, Civil Service (Medical): No    Lack of Transportation (Non-Medical): No  Physical Activity: Not on file  Stress: Not on file  Social Connections: Not on file  Intimate Partner Violence: Not on file    Family History: Family History  Problem Relation Age of Onset   Colitis Father     Review of Systems: ROS Denies any recent chest pain, difficulty breathing, leg swelling, fevers.  Physical Exam: Vital Signs BP 105/64 (BP Location: Left Arm, Patient Position: Sitting, Cuff Size: Small)   Pulse 75   Ht 5\' 7"  (1.702 m)   Wt 127 lb (57.6 kg)   SpO2 97%   BMI 19.89 kg/m   Physical Exam Constitutional:      General: Not in acute distress.    Appearance: Normal appearance. Not ill-appearing.  HENT:     Head: Normocephalic and atraumatic.  Eyes:     Pupils: Pupils are equal, round. Cardiovascular:     Rate and Rhythm: Normal rate.    Pulses: Normal pulses.  Pulmonary:     Effort: No respiratory distress or increased work of breathing.  Speaks in full sentences. Abdominal:     General: Abdomen is flat.   Musculoskeletal: Normal range of motion. No lower extremity swelling or edema. No varicosities. Skin:     General: Skin is warm and dry.     Findings: No erythema or rash.  Neurological:     Mental Status: Alert and oriented to person, place, and time.  Psychiatric:        Mood and Affect: Mood normal.        Behavior: Behavior normal.    Assessment/Plan: The patient is scheduled for scar revision for abdominal adhesions related to previous surgery with Dr. Ulice Bold.  Risks, benefits, and alternatives of procedure discussed, questions answered and consent obtained.    Smoking Status: Non-smoker.  Caprini Score: 3; Risk Factors include: History of ulcerative colitis and length of planned surgery. Recommendation for mechanical prophylaxis. Encourage early ambulation.   Pictures obtained: 04/22/2023  Post-op Rx sent to pharmacy: Tramadol, Keflex, Zofran.  Patient  was provided with the General Surgical Risk consent document and Pain Medication Agreement prior to their appointment.  They had adequate time to read through the risk consent documents and Pain Medication Agreement. We also discussed them in person together during this preop appointment. All of their questions were answered to their satisfaction.  Recommended calling if they have any further questions.  Risk consent form and Pain Medication Agreement to be scanned into patient's chart.    Electronically signed by: Evelena Leyden, PA-C 06/17/2023 11:15 AM

## 2023-06-24 ENCOUNTER — Encounter: Payer: Commercial Managed Care - PPO | Admitting: Plastic Surgery

## 2023-07-01 ENCOUNTER — Encounter (HOSPITAL_BASED_OUTPATIENT_CLINIC_OR_DEPARTMENT_OTHER): Payer: Self-pay | Admitting: Plastic Surgery

## 2023-07-01 ENCOUNTER — Other Ambulatory Visit: Payer: Self-pay

## 2023-07-08 NOTE — Anesthesia Preprocedure Evaluation (Signed)
Anesthesia Evaluation  Patient identified by MRN, date of birth, ID band Patient awake    Reviewed: Allergy & Precautions, NPO status , Patient's Chart, lab work & pertinent test results  History of Anesthesia Complications (+) history of anesthetic complications (ileus)  Airway Mallampati: I  TM Distance: >3 FB Neck ROM: Full    Dental  (+) Dental Advisory Given   Pulmonary neg pulmonary ROS   Pulmonary exam normal breath sounds clear to auscultation       Cardiovascular negative cardio ROS  Rhythm:Regular Rate:Normal     Neuro/Psych  Headaches, neg Seizures    GI/Hepatic Neg liver ROS,neg GERD  ,,UC   Endo/Other  negative endocrine ROS    Renal/GU negative Renal ROS     Musculoskeletal Osteoporosis    Abdominal   Peds  Hematology negative hematology ROS (+)   Anesthesia Other Findings   Reproductive/Obstetrics                              Anesthesia Physical Anesthesia Plan  ASA: 2  Anesthesia Plan: General   Post-op Pain Management: Tylenol PO (pre-op)*   Induction: Intravenous  PONV Risk Score and Plan: 3 and Ondansetron, Dexamethasone and Treatment may vary due to age or medical condition  Airway Management Planned: Oral ETT  Additional Equipment:   Intra-op Plan:   Post-operative Plan: Extubation in OR  Informed Consent: I have reviewed the patients History and Physical, chart, labs and discussed the procedure including the risks, benefits and alternatives for the proposed anesthesia with the patient or authorized representative who has indicated his/her understanding and acceptance.     Dental advisory given  Plan Discussed with: CRNA and Anesthesiologist  Anesthesia Plan Comments: (Risks of general anesthesia discussed including, but not limited to, sore throat, hoarse voice, chipped/damaged teeth, injury to vocal cords, nausea and vomiting, allergic  reactions, lung infection, heart attack, stroke, and death. All questions answered. )         Anesthesia Quick Evaluation

## 2023-07-09 ENCOUNTER — Ambulatory Visit (HOSPITAL_BASED_OUTPATIENT_CLINIC_OR_DEPARTMENT_OTHER): Payer: Commercial Managed Care - PPO | Admitting: Anesthesiology

## 2023-07-09 ENCOUNTER — Ambulatory Visit (HOSPITAL_BASED_OUTPATIENT_CLINIC_OR_DEPARTMENT_OTHER): Payer: Self-pay | Admitting: Anesthesiology

## 2023-07-09 ENCOUNTER — Encounter: Payer: Commercial Managed Care - PPO | Admitting: Surgical

## 2023-07-09 ENCOUNTER — Encounter (HOSPITAL_BASED_OUTPATIENT_CLINIC_OR_DEPARTMENT_OTHER): Payer: Self-pay | Admitting: Plastic Surgery

## 2023-07-09 ENCOUNTER — Encounter (HOSPITAL_BASED_OUTPATIENT_CLINIC_OR_DEPARTMENT_OTHER)
Admission: RE | Disposition: A | Discharge status: Home / Self Care | Payer: Self-pay | Source: Home / Self Care | Attending: Plastic Surgery

## 2023-07-09 ENCOUNTER — Other Ambulatory Visit: Payer: Self-pay

## 2023-07-09 ENCOUNTER — Ambulatory Visit (HOSPITAL_BASED_OUTPATIENT_CLINIC_OR_DEPARTMENT_OTHER)
Admission: RE | Admit: 2023-07-09 | Discharge: 2023-07-09 | Disposition: A | Discharge status: Home / Self Care | Payer: Commercial Managed Care - PPO | Attending: Plastic Surgery | Admitting: Plastic Surgery

## 2023-07-09 DIAGNOSIS — K5190 Ulcerative colitis, unspecified, without complications: Secondary | ICD-10-CM | POA: Diagnosis not present

## 2023-07-09 DIAGNOSIS — K660 Peritoneal adhesions (postprocedural) (postinfection): Secondary | ICD-10-CM | POA: Diagnosis not present

## 2023-07-09 DIAGNOSIS — Z975 Presence of (intrauterine) contraceptive device: Secondary | ICD-10-CM | POA: Insufficient documentation

## 2023-07-09 DIAGNOSIS — N736 Female pelvic peritoneal adhesions (postinfective): Secondary | ICD-10-CM | POA: Diagnosis not present

## 2023-07-09 DIAGNOSIS — Z793 Long term (current) use of hormonal contraceptives: Secondary | ICD-10-CM | POA: Diagnosis not present

## 2023-07-09 DIAGNOSIS — Z01818 Encounter for other preprocedural examination: Secondary | ICD-10-CM

## 2023-07-09 HISTORY — PX: EXCISION MASS ABDOMINAL: SHX6701

## 2023-07-09 LAB — POCT PREGNANCY, URINE: Preg Test, Ur: NEGATIVE

## 2023-07-09 SURGERY — EXCISION, MASS, TORSO
Anesthesia: General | Site: Abdomen

## 2023-07-09 MED ORDER — BUPIVACAINE LIPOSOME 1.3 % IJ SUSP
INTRAMUSCULAR | Status: AC
  Filled 2023-07-09: qty 20

## 2023-07-09 MED ORDER — ACETAMINOPHEN 500 MG PO TABS
1000.0000 mg | ORAL_TABLET | Freq: Once | ORAL | Status: AC
  Administered 2023-07-09: 1000 mg via ORAL

## 2023-07-09 MED ORDER — FENTANYL CITRATE (PF) 100 MCG/2ML IJ SOLN
INTRAMUSCULAR | Status: DC | PRN
  Administered 2023-07-09 (×2): 50 ug via INTRAVENOUS

## 2023-07-09 MED ORDER — SODIUM CHLORIDE (PF) 0.9 % IJ SOLN
INTRAMUSCULAR | Status: AC
  Filled 2023-07-09: qty 10

## 2023-07-09 MED ORDER — VASHE WOUND IRRIGATION OPTIME
TOPICAL | Status: DC | PRN
  Administered 2023-07-09: 34 [oz_av]

## 2023-07-09 MED ORDER — SUCCINYLCHOLINE CHLORIDE 200 MG/10ML IV SOSY
PREFILLED_SYRINGE | INTRAVENOUS | Status: AC
  Filled 2023-07-09: qty 10

## 2023-07-09 MED ORDER — PHENYLEPHRINE 80 MCG/ML (10ML) SYRINGE FOR IV PUSH (FOR BLOOD PRESSURE SUPPORT)
PREFILLED_SYRINGE | INTRAVENOUS | Status: AC
  Filled 2023-07-09: qty 10

## 2023-07-09 MED ORDER — ATROPINE SULFATE 0.4 MG/ML IV SOLN
INTRAVENOUS | Status: AC
  Filled 2023-07-09: qty 1

## 2023-07-09 MED ORDER — OXYCODONE HCL 5 MG PO TABS
5.0000 mg | ORAL_TABLET | ORAL | Status: DC | PRN
Start: 2023-07-09 — End: 2023-07-09

## 2023-07-09 MED ORDER — PROPOFOL 10 MG/ML IV BOLUS
INTRAVENOUS | Status: DC | PRN
  Administered 2023-07-09: 200 mg via INTRAVENOUS

## 2023-07-09 MED ORDER — LIDOCAINE 2% (20 MG/ML) 5 ML SYRINGE
INTRAMUSCULAR | Status: AC
  Filled 2023-07-09: qty 5

## 2023-07-09 MED ORDER — SODIUM CHLORIDE 0.9 % IV SOLN: 250.0000 mL | INTRAVENOUS | Status: DC | PRN

## 2023-07-09 MED ORDER — EPHEDRINE 5 MG/ML INJ
INTRAVENOUS | Status: AC
  Filled 2023-07-09: qty 5

## 2023-07-09 MED ORDER — SODIUM CHLORIDE 0.9 % IV SOLN: INTRAVENOUS | Status: DC | PRN

## 2023-07-09 MED ORDER — FENTANYL CITRATE (PF) 100 MCG/2ML IJ SOLN
INTRAMUSCULAR | Status: AC
  Filled 2023-07-09: qty 2

## 2023-07-09 MED ORDER — ONDANSETRON HCL 4 MG/2ML IJ SOLN
INTRAMUSCULAR | Status: DC | PRN
  Administered 2023-07-09: 4 mg via INTRAVENOUS

## 2023-07-09 MED ORDER — DEXAMETHASONE SODIUM PHOSPHATE 4 MG/ML IJ SOLN
INTRAMUSCULAR | Status: DC | PRN
  Administered 2023-07-09: 5 mg via INTRAVENOUS

## 2023-07-09 MED ORDER — ACETAMINOPHEN 325 MG PO TABS: 650.0000 mg | ORAL_TABLET | ORAL | Status: DC | PRN

## 2023-07-09 MED ORDER — LIDOCAINE HCL (CARDIAC) PF 100 MG/5ML IV SOSY
PREFILLED_SYRINGE | INTRAVENOUS | Status: DC | PRN
  Administered 2023-07-09: 60 mg via INTRAVENOUS

## 2023-07-09 MED ORDER — OXYCODONE HCL 5 MG/5ML PO SOLN
5.0000 mg | Freq: Once | ORAL | Status: DC | PRN
Start: 2023-07-09 — End: 2023-07-09

## 2023-07-09 MED ORDER — PROPOFOL 10 MG/ML IV BOLUS
INTRAVENOUS | Status: AC
  Filled 2023-07-09: qty 20

## 2023-07-09 MED ORDER — ACETAMINOPHEN 500 MG PO TABS
ORAL_TABLET | ORAL | Status: AC
  Filled 2023-07-09: qty 2

## 2023-07-09 MED ORDER — CHLORHEXIDINE GLUCONATE CLOTH 2 % EX PADS: 6.0000 | MEDICATED_PAD | Freq: Once | CUTANEOUS | Status: DC

## 2023-07-09 MED ORDER — OXYCODONE HCL 5 MG PO TABS: 5.0000 mg | ORAL_TABLET | Freq: Once | ORAL | Status: DC | PRN

## 2023-07-09 MED ORDER — CEFAZOLIN SODIUM-DEXTROSE 2-4 GM/100ML-% IV SOLN
2.0000 g | INTRAVENOUS | Status: AC
  Administered 2023-07-09: 2 g via INTRAVENOUS

## 2023-07-09 MED ORDER — SODIUM CHLORIDE 0.9% FLUSH: 3.0000 mL | Freq: Two times a day (BID) | INTRAVENOUS | Status: DC

## 2023-07-09 MED ORDER — DEXAMETHASONE SODIUM PHOSPHATE 10 MG/ML IJ SOLN
INTRAMUSCULAR | Status: AC
  Filled 2023-07-09: qty 1

## 2023-07-09 MED ORDER — MIDAZOLAM HCL 5 MG/5ML IJ SOLN
INTRAMUSCULAR | Status: DC | PRN
  Administered 2023-07-09: 2 mg via INTRAVENOUS

## 2023-07-09 MED ORDER — AMISULPRIDE (ANTIEMETIC) 5 MG/2ML IV SOLN: 10.0000 mg | Freq: Once | INTRAVENOUS | Status: DC | PRN

## 2023-07-09 MED ORDER — LIDOCAINE-EPINEPHRINE 1 %-1:100000 IJ SOLN
INTRAMUSCULAR | Status: DC | PRN
  Administered 2023-07-09: 9 mL

## 2023-07-09 MED ORDER — ONDANSETRON HCL 4 MG/2ML IJ SOLN
INTRAMUSCULAR | Status: AC
  Filled 2023-07-09: qty 2

## 2023-07-09 MED ORDER — ACETAMINOPHEN 325 MG RE SUPP: 650.0000 mg | RECTAL | Status: DC | PRN

## 2023-07-09 MED ORDER — SODIUM CHLORIDE 0.9% FLUSH: 3.0000 mL | INTRAVENOUS | Status: DC | PRN

## 2023-07-09 MED ORDER — CEFAZOLIN SODIUM-DEXTROSE 2-4 GM/100ML-% IV SOLN
INTRAVENOUS | Status: AC
  Filled 2023-07-09: qty 100

## 2023-07-09 MED ORDER — BUPIVACAINE LIPOSOME 1.3 % IJ SUSP
INTRAMUSCULAR | Status: DC | PRN
  Administered 2023-07-09: 20 mL

## 2023-07-09 MED ORDER — FENTANYL CITRATE (PF) 100 MCG/2ML IJ SOLN
25.0000 ug | INTRAMUSCULAR | Status: DC | PRN
  Administered 2023-07-09 (×2): 50 ug via INTRAVENOUS

## 2023-07-09 MED ORDER — FENTANYL CITRATE (PF) 100 MCG/2ML IJ SOLN: 25.0000 ug | INTRAMUSCULAR | Status: DC | PRN

## 2023-07-09 MED ORDER — LACTATED RINGERS IV SOLN: INTRAVENOUS | Status: DC

## 2023-07-09 MED ORDER — MIDAZOLAM HCL 2 MG/2ML IJ SOLN
INTRAMUSCULAR | Status: AC
  Filled 2023-07-09: qty 2

## 2023-07-09 SURGICAL SUPPLY — 68 items
BAND RUBBER #18 3X1/16 STRL (MISCELLANEOUS) IMPLANT
BENZOIN TINCTURE PRP APPL 2/3 (GAUZE/BANDAGES/DRESSINGS) IMPLANT
BINDER ABDOMINAL 9 SM 30-45 (SOFTGOODS) IMPLANT
BLADE CLIPPER SURG (BLADE) IMPLANT
BLADE SURG 15 STRL LF DISP TIS (BLADE) ×1 IMPLANT
BNDG ELASTIC 2INX 5YD STR LF (GAUZE/BANDAGES/DRESSINGS) IMPLANT
CANISTER SUCT 1200ML W/VALVE (MISCELLANEOUS) IMPLANT
CLEANER CAUTERY TIP PAD (MISCELLANEOUS) IMPLANT
CLEANSER WND VASHE 34 (WOUND CARE) IMPLANT
CORD BIPOLAR FORCEPS 12FT (ELECTRODE) IMPLANT
COVER BACK TABLE 60X90IN (DRAPES) ×1 IMPLANT
COVER MAYO STAND STRL (DRAPES) ×1 IMPLANT
DERMABOND ADVANCED .7 DNX12 (GAUZE/BANDAGES/DRESSINGS) IMPLANT
DRAPE LAPAROTOMY 100X72 PEDS (DRAPES) ×1 IMPLANT
DRAPE U-SHAPE 76X120 STRL (DRAPES) IMPLANT
DRESSING MEPILEX FLEX 4X4 (GAUZE/BANDAGES/DRESSINGS) IMPLANT
DRSG MEPILEX FLEX 4X4 (GAUZE/BANDAGES/DRESSINGS) ×1
DRSG TEGADERM 2-3/8X2-3/4 SM (GAUZE/BANDAGES/DRESSINGS) IMPLANT
DRSG TEGADERM 4X4.75 (GAUZE/BANDAGES/DRESSINGS) IMPLANT
ELECT COATED BLADE 2.86 ST (ELECTRODE) IMPLANT
ELECT NDL BLADE 2-5/6 (NEEDLE) IMPLANT
ELECT NEEDLE BLADE 2-5/6 (NEEDLE)
ELECT REM PT RETURN 9FT ADLT (ELECTROSURGICAL) ×1
ELECT REM PT RETURN 9FT PED (ELECTROSURGICAL)
ELECTRODE REM PT RETRN 9FT PED (ELECTROSURGICAL) IMPLANT
ELECTRODE REM PT RTRN 9FT ADLT (ELECTROSURGICAL) IMPLANT
GAUZE SPONGE 2X2 STRL 8-PLY (GAUZE/BANDAGES/DRESSINGS) IMPLANT
GAUZE SPONGE 4X4 12PLY STRL LF (GAUZE/BANDAGES/DRESSINGS) IMPLANT
GAUZE STRETCH 2X75IN STRL (MISCELLANEOUS) IMPLANT
GLOVE BIO SURGEON STRL SZ 6.5 (GLOVE) ×2 IMPLANT
GLOVE BIOGEL M STRL SZ7.5 (GLOVE) ×1 IMPLANT
GLOVE BIOGEL PI IND STRL 8 (GLOVE) IMPLANT
GOWN STRL REUS W/ TWL LRG LVL3 (GOWN DISPOSABLE) ×2 IMPLANT
GOWN STRL REUS W/ TWL XL LVL3 (GOWN DISPOSABLE) ×1 IMPLANT
NDL HYPO 30GX1 BEV (NEEDLE) IMPLANT
NDL PRECISIONGLIDE 27X1.5 (NEEDLE) ×1 IMPLANT
NEEDLE HYPO 30GX1 BEV (NEEDLE)
NEEDLE PRECISIONGLIDE 27X1.5 (NEEDLE)
NS IRRIG 1000ML POUR BTL (IV SOLUTION) IMPLANT
PACK BASIN DAY SURGERY FS (CUSTOM PROCEDURE TRAY) ×1 IMPLANT
PAD FOAM SILICONE BACKED (GAUZE/BANDAGES/DRESSINGS) IMPLANT
PENCIL SMOKE EVACUATOR (MISCELLANEOUS) IMPLANT
POWDER MYRIAD MORCLLS FINE 500 (Miscellaneous) IMPLANT
PWDR MYRIAD MORCELLS FINE 500 (Miscellaneous) ×1 IMPLANT
SHEET MEDIUM DRAPE 40X70 STRL (DRAPES) IMPLANT
SLEEVE SCD COMPRESS KNEE MED (STOCKING) IMPLANT
SPIKE FLUID TRANSFER (MISCELLANEOUS) IMPLANT
STRIP CLOSURE SKIN 1/2X4 (GAUZE/BANDAGES/DRESSINGS) IMPLANT
STRIP SUTURE WOUND CLOSURE 1/2 (MISCELLANEOUS) IMPLANT
SUCTION TUBE FRAZIER 10FR DISP (SUCTIONS) IMPLANT
SUT MNCRL 6-0 UNDY P1 1X18 (SUTURE) IMPLANT
SUT MNCRL AB 3-0 PS2 18 (SUTURE) IMPLANT
SUT MNCRL AB 4-0 PS2 18 (SUTURE) IMPLANT
SUT MON AB 5-0 P3 18 (SUTURE) IMPLANT
SUT MON AB 5-0 PS2 18 (SUTURE) IMPLANT
SUT PDS 3-0 CT2 (SUTURE) ×1
SUT PDS II 3-0 CT2 27 ABS (SUTURE) IMPLANT
SUT PROLENE 5 0 P 3 (SUTURE) IMPLANT
SUT PROLENE 5 0 PS 2 (SUTURE) IMPLANT
SUT PROLENE 6 0 P 1 18 (SUTURE) IMPLANT
SUT VIC AB 4-0 PS2 18 (SUTURE) IMPLANT
SUT VIC AB 5-0 P-3 18X BRD (SUTURE) IMPLANT
SUT VIC AB 5-0 PS2 18 (SUTURE) IMPLANT
SYR BULB EAR ULCER 3OZ GRN STR (SYRINGE) IMPLANT
SYR CONTROL 10ML LL (SYRINGE) ×1 IMPLANT
TOWEL GREEN STERILE FF (TOWEL DISPOSABLE) ×1 IMPLANT
TRAY DSU PREP LF (CUSTOM PROCEDURE TRAY) ×1 IMPLANT
TUBE CONNECTING 20X1/4 (TUBING) IMPLANT

## 2023-07-09 NOTE — Anesthesia Procedure Notes (Signed)
Procedure Name: LMA Insertion Date/Time: 07/09/2023 12:46 PM  Performed by: Ronnette Hila, CRNAPre-anesthesia Checklist: Patient identified, Emergency Drugs available, Suction available and Patient being monitored Patient Re-evaluated:Patient Re-evaluated prior to induction Oxygen Delivery Method: Circle System Utilized Preoxygenation: Pre-oxygenation with 100% oxygen Induction Type: IV induction Ventilation: Mask ventilation without difficulty LMA: LMA inserted LMA Size: 4.0 Number of attempts: 1 Airway Equipment and Method: bite block Placement Confirmation: positive ETCO2 Tube secured with: Tape Dental Injury: Teeth and Oropharynx as per pre-operative assessment

## 2023-07-09 NOTE — Discharge Instructions (Addendum)
INSTRUCTIONS FOR AFTER ABDOMINAL SURGERY  You will likely have some questions about what to expect following your operation.  The following information will help you and your family understand what to expect when you get home.  Following these guidelines will help ensure a smooth recovery and reduce risks of complications.  Postoperative instructions include information on: diet, wound care, medications and physical activity.  AFTER SURGERY Expect to go home after the procedure.  In some cases, you may need to spend one night in the hospital for observation.  DIET This surgery does not require a specific diet.  However, the healthier you eat the better your body can heal. It is important to increasing your protein intake.  Limit foods with high sugar and  carbohydrate content.  Focus on vegetables, meat and other protein sources if you are vegan or vegetarian.  If you undergo liposuction during your procedure it is very important to drink 8 oz of water every hour while awake for 2 days.  If your urine is bright yellow, then it is concentrated, and you need to drink more water.  If you find you are persistently nauseated or unable to take in liquids let us know.  NO TOBACCO USE or EXPOSURE.  This will slow your healing process and increase the risk of a wound.  WOUND CARE Leave the abdominal binder in place for 3 days.  Then you can remove it and shower.  Replace the binder or spanx after your shower.   You may have Topifoam or Lipofoam on.  It is soft and spongy and helps keep you from getting creases if you have liposuction.  This can be removed before the shower and then replaced.  If you need more it is available on [employer] as lipofoam.  If you have steri-strips / tape directly attached to your skin leave them in place. It is OK to get these wet.  No baths, pools or hot tubs for four weeks. We close your incision to leave the smallest and best-looking scar. No ointment or creams on your incisions  until cleared by your surgeon.  No Neosporin (Too many skin reactions with this one).  After the steri-strips are off can use Mederma or Skinuva and start massaging the scar. Continue to wear the binder/spanx or Ace wrap around the clock, including while sleeping, for 6 weeks. This provides added comfort and helps reduce the fluid accumulation at the surgery site.  ACTIVITY No heavy lifting until cleared by the doctor.  For example, no more than a half-gallon of milk.  It is OK to walk and you are encouraged to move your legs to help decrease your risk of getting a blood clot.  It will also help keep you from getting deconditioned.  Every 1 to 2 hours get up and walk for 5 minutes. This will help with a quicker recovery back to normal.  Let pain be your guide so you don't do too much.     SLEEPING / RESTING Sleeping and resting should be in the jack-knife or bent forward position with your head elevated.  This will help reduce pulling on your abdominal incision.  You can elevate your head and upper back with a few pillows and place a pillow under your knees.  Avoid stomach sleeping for 3 months.   WORK Everyone returns to work at different times. As a rough guide, most people take 1 - 2 weeks off prior to returning to work. If you need documentation for your  job give them to the front staff for processing.  DRIVING Arrange for someone to bring you home from the hospital.  You may be able to drive a few days after surgery but not while taking any narcotics or valium.  This is for your safety as well as others sharing the road with you.  BOWEL MOVEMENTS Constipation can occur after anesthesia and while taking pain medication.  It is important to stay ahead for your comfort.  We recommend taking Milk of Magnesia (2 tablespoons; twice a day) while taking the pain pills.  MEDICATIONS (you may receive and should be started after surgery) At your preoperative visit for you history and physical you were  given the following medications: Antibiotic: Start this medication when you get home and take according to the instructions on the bottle. Zofran 4 mg:  This is to treat nausea and vomiting.  You can take this every 6 hours as needed and only if needed. Norco (hydrocodone/acetaminophen) 5/325 mg:  This is only to be used after you have taken the Motrin or the Tylenol. Every 8 hours as needed.  Over the counter Medication to take: Ibuprofen (Motrin) 600 mg:  Take this every 6 hours.  If you have additional pain then take 500 mg of the Tylenol.  Only take the Norco after you have tried these two. MiraLAX or stool softener of choice: Take this according to the bottle if you take the Norco.  WHEN TO CALL Call your surgeon's office if any of the following occur:  Fever 101 degrees F or greater  Excessive bleeding or fluid from the incision site.  Pain that increases over time without aid from the medications  Redness, warmth, or pus draining from incision sites  Persistent nausea or inability to take in liquids  Severe misshapen area that underwent the operation.  No Tylenol until 4:30pm.

## 2023-07-09 NOTE — Transfer of Care (Signed)
Immediate Anesthesia Transfer of Care Note  Patient: Brooke Mckee  Procedure(s) Performed: excision of abdominal adhesions (Abdomen) myriad placement (Abdomen)  Patient Location: PACU  Anesthesia Type:General  Level of Consciousness: awake, alert , oriented, drowsy, and patient cooperative  Airway & Oxygen Therapy: Patient Spontanous Breathing and Patient connected to face mask oxygen  Post-op Assessment: Report given to RN and Post -op Vital signs reviewed and stable  Post vital signs: Reviewed and stable  Last Vitals:  Vitals Value Taken Time  BP    Temp    Pulse 88 07/09/23 1344  Resp 14 07/09/23 1344  SpO2 100 % 07/09/23 1344  Vitals shown include unfiled device data.  Last Pain:  Vitals:   07/09/23 1026  TempSrc: Oral  PainSc: 0-No pain         Complications: No notable events documented.

## 2023-07-09 NOTE — Op Note (Signed)
DATE OF OPERATION: 07/09/2023  LOCATION: [facility]  PREOPERATIVE DIAGNOSIS: Adhesion of abdominal wall from previous surgery  POSTOPERATIVE DIAGNOSIS: Same  PROCEDURE: Excision of adhesion of abdominal wall from previous surgery 2 x 6 cm Placement of myriad 500 mg  SURGEON: Foster Simpson, DO  ASSISTANT: Evelena Leyden, PA  EBL: None  CONDITION: Stable  COMPLICATIONS: None  INDICATION: The patient, Brooke Mckee, is a 32 y.o. female born on January 03, 1991, is here for treatment of adhesion of abdominal wall from a previous surgery many years ago.  This was causing pain and discomfort as it was catching with movement.Marland Kitchen   PROCEDURE DETAILS:  The patient was seen prior to surgery and marked.  The IV antibiotics were given. The patient was taken to the operating room and given a general anesthetic. A standard time out was performed and all information was confirmed by those in the room. SCDs were placed.   The abdomen was prepped and draped.  1% lidocaine was injected just at the skin level for intraoperative hemostasis.  The 15 blade was used to make a small incision at the previous scar site.  Liposuction was done to decrease the fullness around the adhesion for better contour.  150 cc was removed.  The #15 blade was then used to excise the skin and soft tissue adhesion.  It was 2 x 6 cm in size.  The Bovie was used to release the adhesion that was down to the abdominal wall.  This was an area of about 10 x 10 cm.  Hemostasis was achieved with electrocautery.  Exparel was then injected around the area.  The myriad powder was then placed in the space.  All of it was used.  The deep layers were then closed with the 3-0 PDS followed by 3-0 Monocryl for the soft tissue.  The skin was closed with 4-0 Monocryl.  They dressing of Dermabond and Steri-Strips with a Mepilex border was applied.  Patient was placed in abdominal binder. The patient was allowed to wake up and taken to  recovery room in stable condition at the end of the case. The family was notified at the end of the case.   The advanced practice practitioner (APP) assisted throughout the case.  The APP was essential in retraction and counter traction when needed to make the case progress smoothly.  This retraction and assistance made it possible to see the tissue plans for the procedure.  The assistance was needed for blood control, tissue re-approximation and assisted with closure of the incision site.

## 2023-07-09 NOTE — Anesthesia Postprocedure Evaluation (Signed)
Anesthesia Post Note  Patient: Estate manager/land agent  Procedure(s) Performed: excision of abdominal adhesions (Abdomen) myriad placement (Abdomen)     Patient location during evaluation: PACU Anesthesia Type: General Level of consciousness: awake Pain management: pain level controlled Vital Signs Assessment: post-procedure vital signs reviewed and stable Respiratory status: spontaneous breathing, nonlabored ventilation and respiratory function stable Cardiovascular status: blood pressure returned to baseline and stable Postop Assessment: no apparent nausea or vomiting Anesthetic complications: no   No notable events documented.  Last Vitals:  Vitals:   07/09/23 1400 07/09/23 1435  BP: 108/64 (!) 115/45  Pulse: 72 70  Resp: 11 16  Temp:  36.4 C  SpO2: 95% 100%    Last Pain:  Vitals:   07/09/23 1435  TempSrc: Temporal  PainSc: 2                  Linton Rump

## 2023-07-09 NOTE — Interval H&P Note (Signed)
History and Physical Interval Note:  07/09/2023 12:08 PM  Brooke Mckee  has presented today for surgery, with the diagnosis of Adhesion of abdominal wall.  The various methods of treatment have been discussed with the patient and family. After consideration of risks, benefits and other options for treatment, the patient has consented to  Procedure(s): excision of abdominal adhesions (N/A) myriad placement (N/A) as a surgical intervention.  The patient's history has been reviewed, patient examined, no change in status, stable for surgery.  I have reviewed the patient's chart and labs.  Questions were answered to the patient's satisfaction.     Alena Bills Harland Aguiniga

## 2023-07-10 ENCOUNTER — Encounter (HOSPITAL_BASED_OUTPATIENT_CLINIC_OR_DEPARTMENT_OTHER): Payer: Self-pay | Admitting: Plastic Surgery

## 2023-07-11 ENCOUNTER — Ambulatory Visit (INDEPENDENT_AMBULATORY_CARE_PROVIDER_SITE_OTHER): Payer: Commercial Managed Care - PPO | Admitting: Physician Assistant

## 2023-07-11 DIAGNOSIS — K660 Peritoneal adhesions (postprocedural) (postinfection): Secondary | ICD-10-CM

## 2023-07-11 NOTE — Progress Notes (Addendum)
Patient is a pleasant 31 year old female with history of abdominal adhesions subsequent to laparoscopic surgery for ulcerative colitis now s/p scar revision and liposuction with placement of myriad tissue matrix performed 07/09/2023 by Dr. Ulice Bold who joins via telephone for postoperative day 2 check-in.  Today, she is doing well.  She denies any abdominal discomfort and has been wearing the binder 24/7.  She denies any chest pain, difficulty breathing, leg swelling, or fevers.  She does however endorse lightheadedness/near syncope that she attributes to anesthesia.  She is tolerating p.o. intake and voiding.  Ambulatory.  No other issues or concerns.  Patient's report of near syncope/lightheadedness is concerning for ABLA, but patient denies any abdominal discomfort or obvious swelling that would suggest intraabdominal bleeding and is instead suspecting that her symptoms are related to her anesthesia.  She anticipates that it will wear off.  Patient understands to call the office should she develop any new or worsening symptoms.  Otherwise, follow-up next week as scheduled.

## 2023-07-16 IMAGING — CT CT ABD-PELV W/ CM
2 of 4 series · 16 of 46 positions shown, 18 images · IV contrast (APPLIED)
Comparison: None.

CLINICAL DATA: Bowel obstruction suspected. History of ulcerative
colitis.

EXAM:
CT ABDOMEN AND PELVIS WITH CONTRAST
TECHNIQUE: Multidetector CT imaging of the abdomen and pelvis was performed
using the standard protocol following bolus administration of
intravenous contrast.
CONTRAST:  75mL OMNIPAQUE IOHEXOL 300 MG/ML  SOLN

[Series 3: abd/ pelvis 5.0 i30f 2 · axial · 0.74mm/px · z∈[-1157,-737]mm · 13 of 92 slices shown, 15 images]
[im 4/92  soft-tissue]
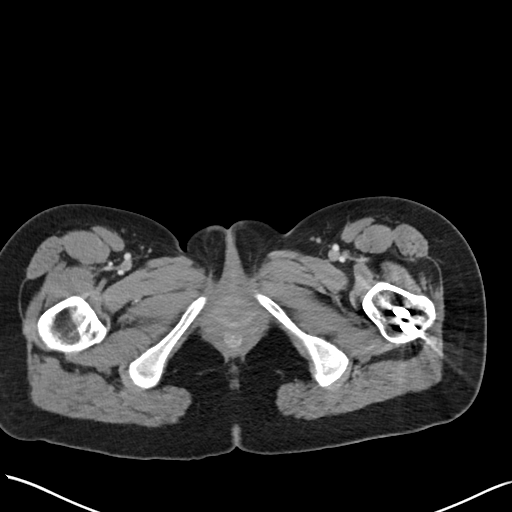
[im 4/92  bone]
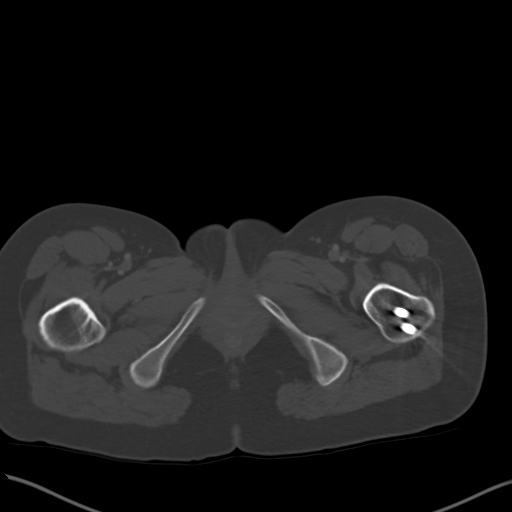
[im 11/92  soft-tissue]
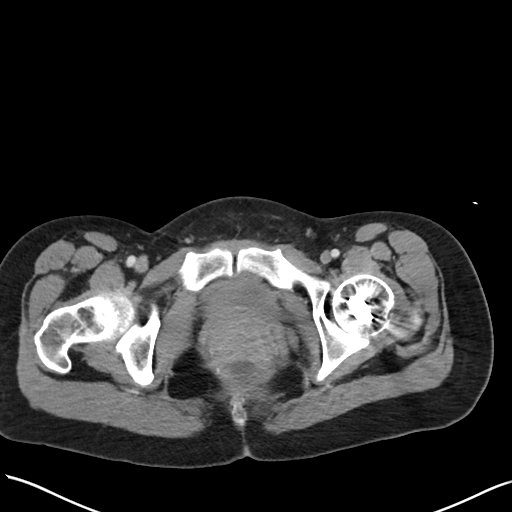
[im 18/92  soft-tissue]
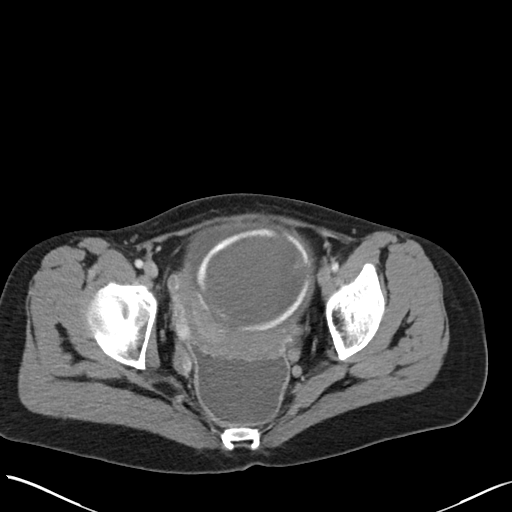
[im 25/92  soft-tissue]
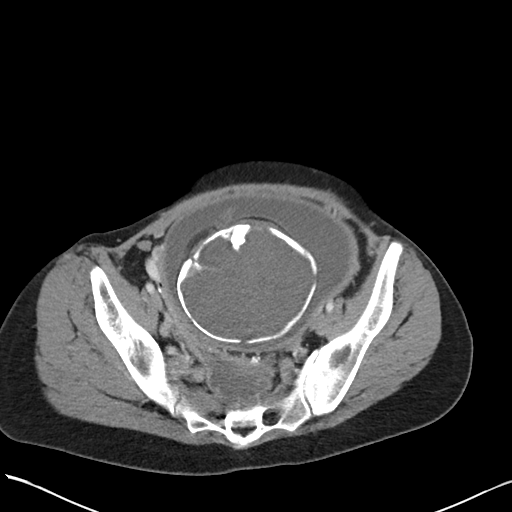
[im 32/92  soft-tissue]
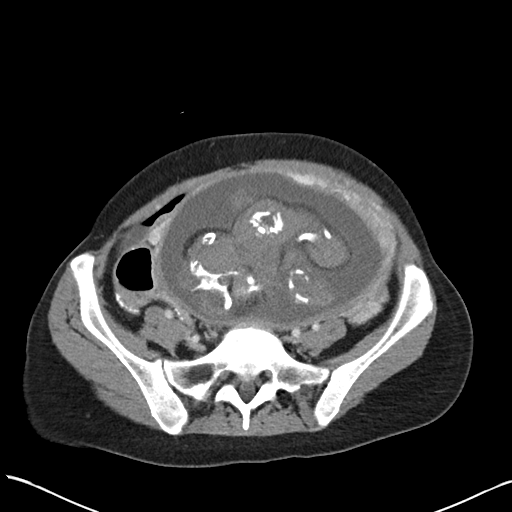
[im 39/92  soft-tissue]
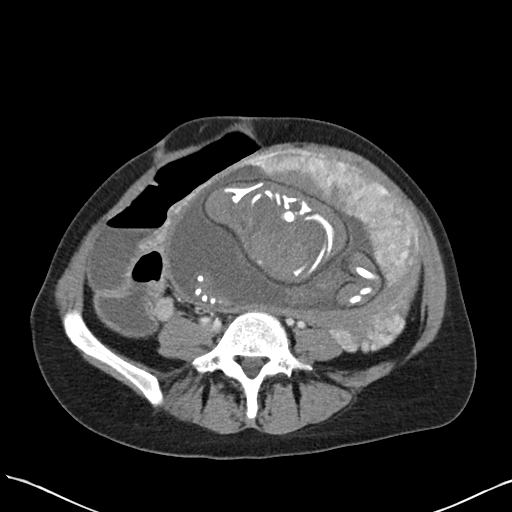
[im 46/92  soft-tissue]
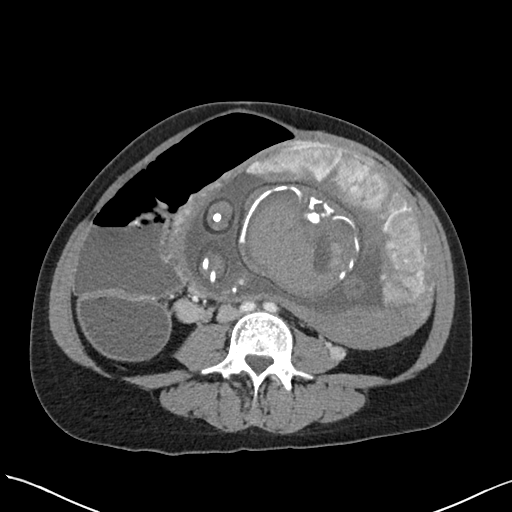
[im 53/92  soft-tissue]
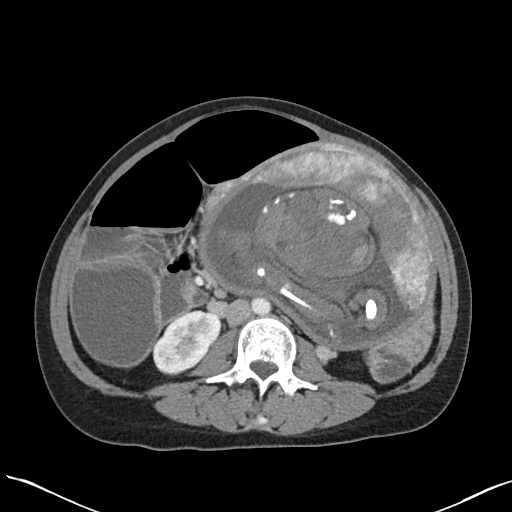
[im 60/92  soft-tissue]
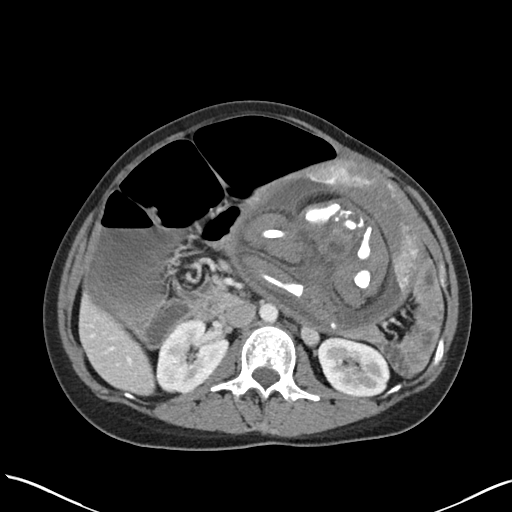
[im 60/92  bone]
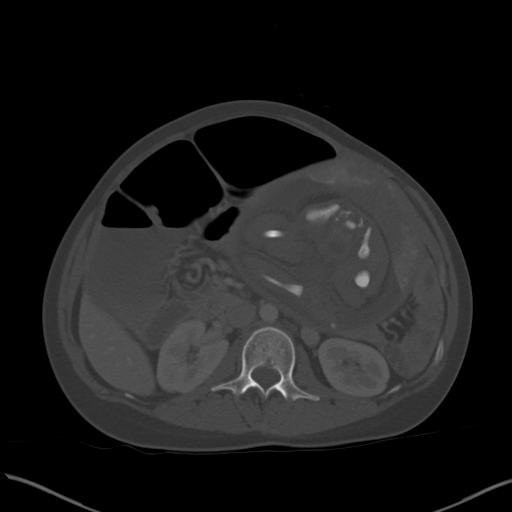
[im 67/92  soft-tissue]
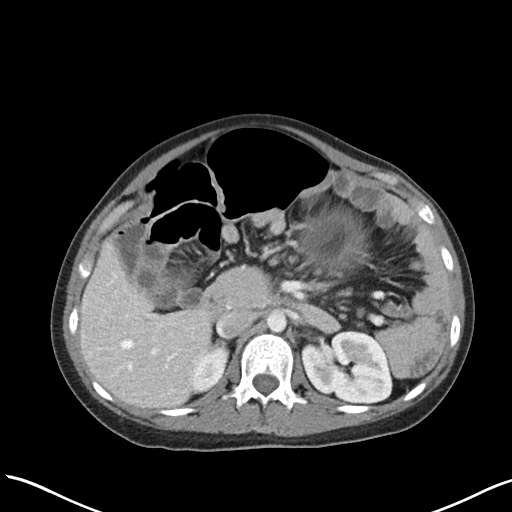
[im 74/92  soft-tissue]
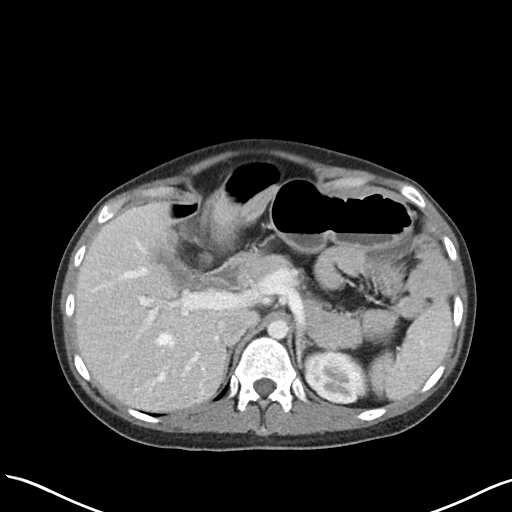
[im 81/92  soft-tissue]
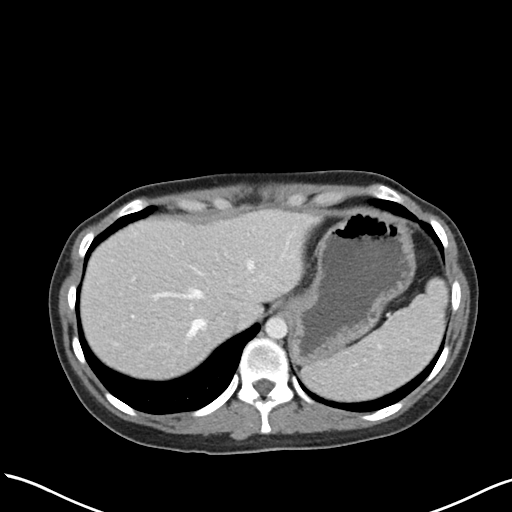
[im 88/92  soft-tissue]
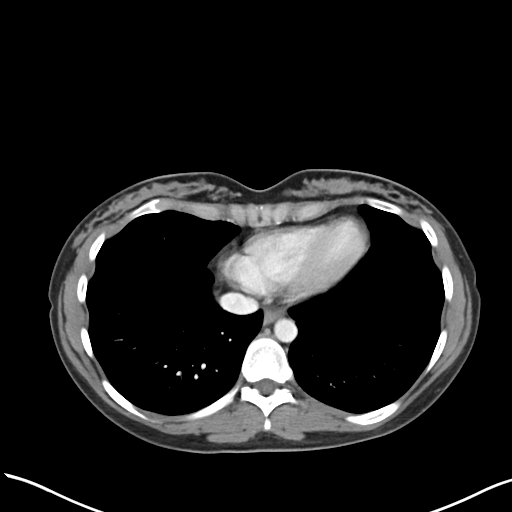

[Series 6: coronal soft tissue · coronal · 0.71mm/px · 3 of 95 slices shown]
[im 32/95  soft-tissue]
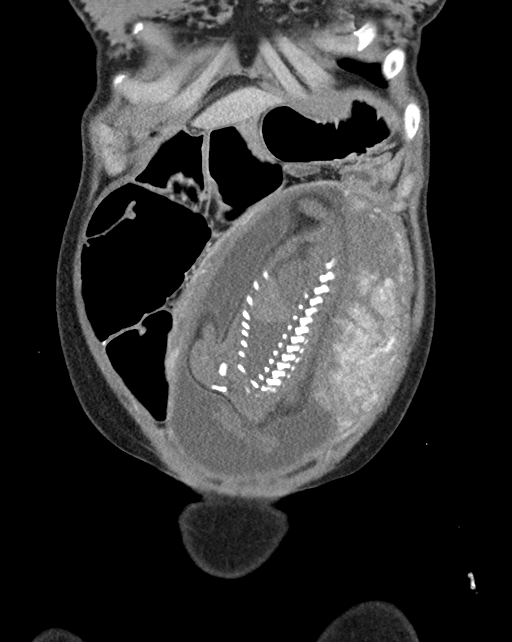
[im 42/95  soft-tissue]
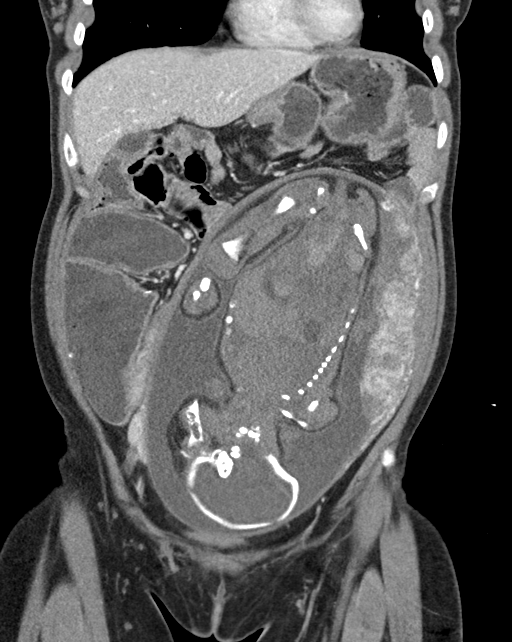
[im 53/95  soft-tissue]
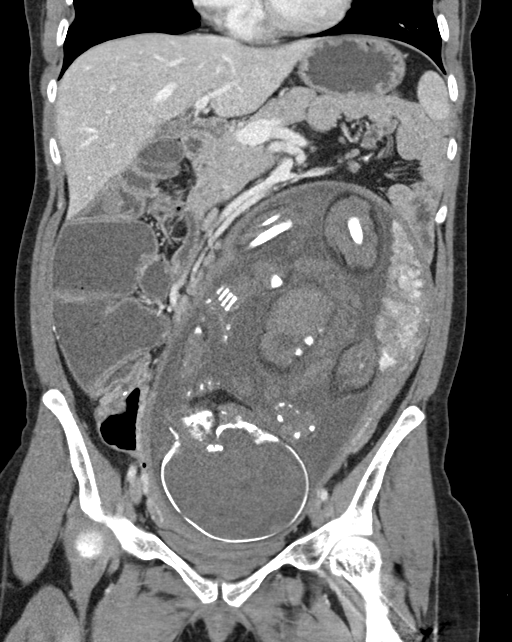

[16 of 46 positions shown; findings below may reference images not displayed]

FINDINGS: Lower chest: Clear lung bases. Normal heart size without pericardial
or pleural effusion.

Hepatobiliary: Normal liver. The gallbladder is possibly identified
on [DATE], surrounded by small bowel loops. No calcified stone or
surrounding inflammation. No biliary duct dilatation.

Pancreas: Normal, without mass or ductal dilatation.

Spleen: Normal in size, without focal abnormality.

Adrenals/Urinary Tract: Normal adrenal glands. Normal kidneys,
without hydronephrosis. Decompressed urinary bladder.

Stomach/Bowel: Normal stomach, without wall thickening.

Status post colectomy and ileoanal anastomosis with J-pouch. No
inflammation of the J-pouch, which is fluid-filled. Proximal small
bowel is normal in caliber. The distal small bowel just proximal to
the anastomosis is dilated and fluid-filled within the right-side of
the abdomen, including at 6.4 cm on coronal image 58.

Vascular/Lymphatic: Normal caliber of the aorta and branch vessels.
No abdominopelvic adenopathy.

Reproductive: Intrauterine pregnancy which is cephalic. The placenta
is positioned superiorly and anteriorly. No dominant adnexal mass.

Other: No abdominal ascites or free intraperitoneal air. No pelvic
fluid.

Musculoskeletal: Proximal left femur fixation.
IMPRESSION: 1. Status post colectomy and ileoanal anastomosis. The small bowel
proximal to the anastomosis is moderately dilated, suggesting
partial small bowel obstruction. No evidence of inflammation
involving the J-pouch or the small bowel.
2. No other explanation for abdominal pain.

## 2023-07-18 ENCOUNTER — Other Ambulatory Visit (HOSPITAL_BASED_OUTPATIENT_CLINIC_OR_DEPARTMENT_OTHER): Payer: Self-pay | Admitting: Student

## 2023-07-18 DIAGNOSIS — M7632 Iliotibial band syndrome, left leg: Secondary | ICD-10-CM

## 2023-07-31 ENCOUNTER — Other Ambulatory Visit (HOSPITAL_BASED_OUTPATIENT_CLINIC_OR_DEPARTMENT_OTHER): Payer: Self-pay | Admitting: Student

## 2023-07-31 ENCOUNTER — Ambulatory Visit (HOSPITAL_BASED_OUTPATIENT_CLINIC_OR_DEPARTMENT_OTHER): Payer: Commercial Managed Care - PPO | Admitting: Physical Therapy

## 2023-07-31 ENCOUNTER — Other Ambulatory Visit (HOSPITAL_COMMUNITY): Payer: Self-pay

## 2023-07-31 MED ORDER — METHOCARBAMOL 500 MG PO TABS
500.0000 mg | ORAL_TABLET | Freq: Four times a day (QID) | ORAL | 0 refills | Status: DC
  Filled 2023-07-31 (×2): qty 40, 10d supply, fill #0

## 2023-08-04 NOTE — Telephone Encounter (Signed)
 Pt chart was reviewed and noted that she has an appointment to see Elida Nyle Sharps NP tomorrow on 08/05/2023 at 3:00 PM. Pt made aware. Pt was notified that Elida will order test after office visit and evaluation.  Pt verbalized understanding with all questions answered.

## 2023-08-05 ENCOUNTER — Encounter: Payer: Self-pay | Admitting: Nurse Practitioner

## 2023-08-05 ENCOUNTER — Ambulatory Visit (HOSPITAL_BASED_OUTPATIENT_CLINIC_OR_DEPARTMENT_OTHER): Payer: Commercial Managed Care - PPO | Attending: Student | Admitting: Physical Therapy

## 2023-08-05 ENCOUNTER — Encounter (HOSPITAL_BASED_OUTPATIENT_CLINIC_OR_DEPARTMENT_OTHER): Payer: Self-pay | Admitting: Physical Therapy

## 2023-08-05 ENCOUNTER — Ambulatory Visit: Payer: Commercial Managed Care - PPO | Admitting: Nurse Practitioner

## 2023-08-05 ENCOUNTER — Other Ambulatory Visit: Payer: Self-pay

## 2023-08-05 ENCOUNTER — Other Ambulatory Visit: Payer: Commercial Managed Care - PPO

## 2023-08-05 ENCOUNTER — Telehealth: Payer: Self-pay

## 2023-08-05 VITALS — BP 112/74 | HR 102 | Ht 67.0 in | Wt 110.5 lb

## 2023-08-05 DIAGNOSIS — M25552 Pain in left hip: Secondary | ICD-10-CM | POA: Diagnosis not present

## 2023-08-05 DIAGNOSIS — M6281 Muscle weakness (generalized): Secondary | ICD-10-CM | POA: Insufficient documentation

## 2023-08-05 DIAGNOSIS — K51919 Ulcerative colitis, unspecified with unspecified complications: Secondary | ICD-10-CM | POA: Diagnosis not present

## 2023-08-05 DIAGNOSIS — R197 Diarrhea, unspecified: Secondary | ICD-10-CM

## 2023-08-05 DIAGNOSIS — M7632 Iliotibial band syndrome, left leg: Secondary | ICD-10-CM | POA: Diagnosis not present

## 2023-08-05 LAB — COMPREHENSIVE METABOLIC PANEL
ALT: 12 U/L (ref 0–35)
AST: 11 U/L (ref 0–37)
Albumin: 3.7 g/dL (ref 3.5–5.2)
Alkaline Phosphatase: 71 U/L (ref 39–117)
BUN: 25 mg/dL — ABNORMAL HIGH (ref 6–23)
CO2: 27 meq/L (ref 19–32)
Calcium: 9.3 mg/dL (ref 8.4–10.5)
Chloride: 93 meq/L — ABNORMAL LOW (ref 96–112)
Creatinine, Ser: 0.79 mg/dL (ref 0.40–1.20)
GFR: 97.7 mL/min (ref >60.00)
Glucose, Bld: 96 mg/dL (ref 70–99)
Potassium: 3.9 meq/L (ref 3.5–5.1)
Sodium: 133 meq/L — ABNORMAL LOW (ref 135–145)
Total Bilirubin: 0.2 mg/dL (ref 0.2–1.2)
Total Protein: 7.8 g/dL (ref 6.0–8.3)

## 2023-08-05 LAB — CBC WITH DIFFERENTIAL/PLATELET
Basophils Absolute: 0.1 10*3/uL (ref 0.0–0.1)
Basophils Relative: 0.5 % (ref 0.0–3.0)
Eosinophils Absolute: 0 10*3/uL (ref 0.0–0.7)
Eosinophils Relative: 0.3 % (ref 0.0–5.0)
HCT: 30.2 % — ABNORMAL LOW (ref 36.0–46.0)
Hemoglobin: 9.2 g/dL — ABNORMAL LOW (ref 12.0–15.0)
Lymphocytes Relative: 25 % (ref 12.0–46.0)
Lymphs Abs: 3.1 10*3/uL (ref 0.7–4.0)
MCHC: 30.5 g/dL (ref 30.0–36.0)
MCV: 59.6 fL — ABNORMAL LOW (ref 78.0–100.0)
Monocytes Absolute: 0.9 10*3/uL (ref 0.1–1.0)
Monocytes Relative: 7 % (ref 3.0–12.0)
Neutro Abs: 8.3 10*3/uL — ABNORMAL HIGH (ref 1.4–7.7)
Neutrophils Relative %: 67.2 % (ref 43.0–77.0)
Platelets: 1063 10*3/uL (ref 150.0–400.0)
RBC: 5.06 Mil/uL (ref 3.87–5.11)
RDW: 19.2 % — ABNORMAL HIGH (ref 11.5–15.5)
WBC: 12.4 10*3/uL — ABNORMAL HIGH (ref 4.0–10.5)

## 2023-08-05 LAB — C-REACTIVE PROTEIN: CRP: 7.6 mg/dL (ref 0.5–20.0)

## 2023-08-05 MED ORDER — SCOPOLAMINE 1 MG/3DAYS TD PT72: 1.0000 | MEDICATED_PATCH | TRANSDERMAL | 0 refills | Status: DC

## 2023-08-05 NOTE — Progress Notes (Signed)
 08/05/2023 Brooke Mckee [id number] 1991-05-28   Chief Complaint: N/V and diarrhea   History of Present Illness: Brooke Mckee is a 33 year old female  with a history of Ulcerative Colitis s/p laparoscopic total colectomy with ileostomy in 2012 and subsequent reversal with J-pouch.  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  S/P excision of abdominal adhesions 07/09/2023 by plastic surgery. History of recurrent C. Diff, she believes she was last diagnosed  with C diff and treated with a course of Vancomycin 05/2021. IDA previously treated with IV iron and PRBC transfusion 2012. Family history notable for father with UC, paternal uncle with UC, diagnosed colon cancer.  Sister with Crohn Disease.  She is followed by Dr. San.  She presents today for further evaluation regarding suspected norovirus.  Her older son developed N/V/D on 07/21/2024 and her younger son and husband developed the same symptoms on 07/23/2023. Norovirus was suspected, family was not officially tested for Norovirus.  She developed N/V/D on Sunday 12/29.  She vomited 10-12 times on the first day and had numerous episodes of diarrhea.  She took Cipro  500 mg x 1 from her home supply on Monday 12/30 without improvement. She and her husband who is an orthopedic physician were concerned that she might have C. Diff therefore she took Vancomycin 250mg  two capsules x 1 dose on 12/31 then bid through Friday 08/01/2023 then stopped it as her diarrhea did not improve.  She took a dose of Robaxin  as prescribed by her husband and her vomiting abated on Saturday 1/4, however, she still has significant nausea.  Ondansetron  is not effective.  Scopolamine  patch was effective when used in the past.  She does not wish to try Phenergan  which results in drowsiness if she is taking care of her 2 young sons.  She continues to pass 2-7 episodes of urgent bright yellow-brown watery diarrhea bowel movements daily.   No bloody diarrhea.  No mucus per the rectum.  She has intermittent pain above the umbilicus.  She stated her current diarrhea is not similar to the symptoms she experiences when having active pouchitis.  She describes feeling blocked when having episodes of pouchitis which typically resolve after taking a course of Cipro  and Flagyl .  She also noted having a flare of rheumatoid arthritis and recurrence of a geographical tongue.  She is trying to hydrate, tolerating small bits of bland food.  PAST GI PROCEDURES:   - 11/2015: Flexible sigmoidoscopy: Pouchitis.  Treated with Cipro /Flagyl  - 05/2021: Hospital admission with pouchitis while [redacted] weeks pregnant.  Negative GI PCR panel.  CRP 9.9, calprotectin 603 - 06/04/2021: CT A/P: S/p colectomy and ileoanal anastomosis.  Moderate dilation of SB proximal to anastomosis s/o pSBO.  No inflammation of J-pouch or SB -06/05/2021: Flexible sigmoidoscopy: Mild, distal pouchitis.  Healthy-appearing anastomosis.  Normal-appearing small bowel.  Treated with Flagyl  monotherapy due to pregnancy -  Flexible sigmoidoscopy 06/06/2023: Patent ileal pouch-anal anastomosis, characterized by edema, erythema and ulceration. This is characterized as mild pouchitis. This was biopsied. - No evidence of cuffitis. - The neo-terminal ileum is otherwise normal appearing. There were no strictures or areas of luminal narrowing noted.  Biopsies of the ileum showed chronic active enteritis without granulomata, dysplasia or malignancy.  Biopsies of the pouch showed chronic active enteritis that granulomata, dysplasia or malignancy.  Current Outpatient Medications on File Prior to Visit  Medication Sig Dispense Refill   levonorgestrel  (MIRENA ) 20 MCG/DAY IUD 1 each by Intrauterine route once.  vancomycin (VANCOCIN) 125 MG capsule Take 125 mg by mouth 4 (four) times daily. (Patient not taking: Reported on 08/05/2023)     No current facility-administered medications on file prior to visit.    Allergies  Allergen Reactions   Morphine Hives    All over body. Pt states she hast tolerated hydromorphone  in the past    Current Medications, Allergies, Past Medical History, Past Surgical History, Family History and Social History were reviewed in [employer] record.  Review of Systems:   Constitutional: Negative for fever, sweats or chills. ? weight loss.  Respiratory: Negative for shortness of breath.   Cardiovascular: Negative for chest pain, palpitations and leg swelling.  Gastrointestinal: See HPI.  Musculoskeletal: Negative for back pain or muscle aches.  Neurological: Negative for dizziness, headaches or paresthesias.    Physical Exam: BP 112/74   Pulse (!) 102   Ht 5' 7 (1.702 m)   Wt 110 lb 8 oz (50.1 kg)   LMP 06/29/2023 (Exact Date)   SpO2 98%   BMI 17.31 kg/m  General: Pale appearing 33 year old female in no acute distress. Head: Normocephalic and atraumatic. Eyes: No scleral icterus. Conjunctiva pink . Ears: Normal auditory acuity. Mouth: Dentition intact. No ulcers or lesions.  Lungs: Clear throughout to auscultation. Heart: Regular rate and rhythm, no murmur. Abdomen: Soft, nondistended.  Mild tenderness above the umbilicus without rebound or guarding.  No masses or hepatomegaly. Normal bowel sounds x 4 quadrants.  Right mid abdominal incision intact/closed without drainage or erythema. Rectal: Deferred.  Musculoskeletal: Symmetrical with no gross deformities. Extremities: No edema. Neurological: Alert oriented x 4. No focal deficits.  Psychological: Alert and cooperative. Normal mood and affect  Assessment and Recommendations:  33 year old female with ulcerative colitis s/p laparoscopic total colectomy with ileostomy in 2012 and subsequent reversal with J-pouch. History pouchitis 1 - 2 times yearly, typically resolves with Cipro  and Flagyl  who presents with suspected viral gastroenteritis/possible norovirus.  Her 2 young sons  and husband initially developed N/V/D 12/24- 12/25 and she developed the same symptoms 12/29.  She took 1 dose of Cipro  and Vancomycin (old supply) for a few days for suspected recurrent C. difficile without improvement so she stopped it.  -[facility] GI pathogen panel with C. Difficile -CBC, CMP and CRP -Scopolamine  patch 1mg /3 days, remove within 72 hours  -Push fluids, bland diet -Patient to contact office if symptoms worsen, to go to the ED if she cannot hydrate or if she experiences severe abdominal pain -Dr. San updated and agreed with the above plan -Further recommendations to be determined after the above lab results received  History of C. Difficile 2017. Patient endorsed having C. Diff x 3 since colectomy surgery.   S/P excision of abdominal adhesions 07/09/2023 by plastic surgery  Today's encounter was 25 minutes which included precharting, chart/result review, history/exam, face-to-face time used for counseling, formulating a treatment plan with follow-up and documentation.

## 2023-08-05 NOTE — Therapy (Signed)
 OUTPATIENT PHYSICAL THERAPY EVALUATION   Patient Name: Brooke Mckee MRN: [id number] DOB:10/12/1990, 33 y.o., female Today's Date: 08/05/2023  END OF SESSION:  PT End of Session - 08/05/23 1018     Visit Number 1    Number of Visits 17    Date for PT Re-Evaluation 10/04/23    Authorization Type [facility] [employer]    PT Start Time 1017    PT Stop Time 1100    PT Time Calculation (min) 43 min    Activity Tolerance Patient tolerated treatment well    Behavior During Therapy WFL for tasks assessed/performed             Past Medical History:  Diagnosis Date   Migraines    Pouchitis (HCC)    Ulcerative colitis (HCC)    Past Surgical History:  Procedure Laterality Date   BIOPSY  06/05/2021   Procedure: BIOPSY;  Surgeon: San Sandor GAILS, DO;  Location: [facility];  Service: Gastroenterology;;   CESAREAN SECTION     CESAREAN SECTION N/A 07/11/2021   Procedure: CESAREAN SECTION;  Surgeon: Eveline Lynwood MATSU, MD;  Location: [facility];  Service: Obstetrics;  Laterality: N/A;   EXCISION MASS ABDOMINAL N/A 07/09/2023   Procedure: excision of abdominal adhesions;  Surgeon: Lowery Estefana RAMAN, DO;  Location: [facility];  Service: Plastics;  Laterality: N/A;   FLEXIBLE SIGMOIDOSCOPY N/A 06/05/2021   Procedure: FLEXIBLE SIGMOIDOSCOPY;  Surgeon: San Sandor GAILS, DO;  Location: [facility];  Service: Gastroenterology;  Laterality: N/A;  unsedated procedure   ILEAL POUCH  2012   [place]   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 3 months)   Patient Active Problem List   Diagnosis Date Noted   Adhesion of abdominal wall 05/02/2023   Encounter for counseling 12/16/2022   IUD (intrauterine device) in place 10/01/2022   Pouchitis (HCC) 06/05/2021   Diarrhea of presumed infectious origin    Fibrocystic disease of breast 06/04/2021   Partial small bowel obstruction (HCC) 06/04/2021   History of C-section 04/17/2021   Migraine without status migrainosus, not intractable  09/12/2017   Ulcerative colitis with complication (HCC) 01/28/2017   Osteoporosis without current pathological fracture 01/28/2017    REFERRING PROVIDER:  Emiliano Leonce CROME, PA-C   REFERRING DIAG:  [phone] (ICD-10-CM) - It band syndrome, left    Rationale for Evaluation and Treatment: Rehabilitation  THERAPY DIAG:  Pain in left hip  Muscle weakness (generalized)  ONSET DATE: chronic   SUBJECTIVE:  SUBJECTIVE STATEMENT: Long h/p of prednisone. Bone spur at ITB, last cortisone injection right before christmas. Flare ups about 1/year.   PERTINENT HISTORY:  UC, OP, h/o C-section  PAIN:  Are you having pain? Yes: NPRS scale: mild-moderate  Pain location: Lt lateral hip Pain description: snap Aggravating factors: stair climber, squats Relieving factors: cortisone injection  PRECAUTIONS:  None  RED FLAGS: None   WEIGHT BEARING RESTRICTIONS:  No  FALLS:  Has patient fallen in last 6 months? No  PLOF:  Independent  PATIENT GOALS:  Do squats without pain.    OBJECTIVE:  Note: Objective measures were completed at Evaluation unless otherwise noted.  PATIENT SURVEYS:  N/a  COGNITIVE STATUS: Within functional limits for tasks assessed   POSTURE:  Eval: Lt PSIS elevation, Rt post innom rotation with functional LLD (Lt longer)  Lumbar dextroscoliosis    Eval: SLS trendelenburg on Right.                                                                                                                             TREATMENT DATE:   Treatment                            08/05/23:  Hesch correction for Rt pot innom rotation Core contraction in hooklying Bridge with green tband at knees on heels Trigger Point Dry Needling  Initial Treatment: Pt instructed on Dry Needling rational,  procedures, and possible side effects. Pt instructed to expect mild to moderate muscle soreness later in the day and/or into the next day.  Pt instructed in methods to reduce muscle soreness. Pt instructed to continue prescribed HEP. Patient verbalized understanding of these instructions and education.   Patient Verbal Consent Given: Yes Education Handout Provided: Yes Muscles Treated: Lt glut med/min/TFL, piriformis Electrical Stimulation Performed: No Treatment Response/Outcome: twitch with reduced concordant symptoms      PATIENT EDUCATION:  Education details: Teacher, Music of condition, POC, HEP, exercise form/rationale Person educated: Patient Education method: Explanation, Demonstration, Tactile cues, Verbal cues, and Handouts Education comprehension: verbalized understanding, returned demonstration, verbal cues required, tactile cues required, and needs further education  HOME EXERCISE PROGRAM: [id number] Requested upright bike for cardio (in place of stair stepper)   ASSESSMENT:  CLINICAL IMPRESSION: Patient is a 33 y.o. F who was seen today for physical therapy evaluation and treatment for flare-up of chronic Lt lateral hip pain. Pt is dx with osteoporosis following long duration of steroids and fell with resultant fracture on left hip. Pt reports that screw in Lt hip is unable to be removed as the screw is stripped and the spur will require conservative management. Does occasional steroid injections with flares that are helpful and has done PT in the past for strengthening which was also helpful. Pt reports dx of scoliosis at a young age.    REHAB POTENTIAL: Good  CLINICAL DECISION MAKING: Evolving/moderate complexity  EVALUATION COMPLEXITY: Moderate  GOALS: Goals reviewed with patient? Yes  SHORT TERM GOALS: Target date: 2/1  Presents with level pelvis for resolution of functional LLD  Goal status: INITIAL  2.  Cardio in gym without snapping  Goal status:  INITIAL   LONG TERM GOALS: Target date: POC date  Independent in HEP for lumbopelvic strength and stability   Goal status: INITIAL  2.  Able to perform squatting motion for functional needs without hip pain  Goal status: INITIAL  3.  Good control with dynamic balance activities to reduce risk of fall resulting in fx  Goal status: INITIAL     PLAN:  PT FREQUENCY: 1-2x/week  PT DURATION: 8 weeks  PLANNED INTERVENTIONS: 97164- PT Re-evaluation, 97110-Therapeutic exercises, 97530- Therapeutic activity, 97112- Neuromuscular re-education, 97535- Self Care, 02859- Manual therapy, [phone]- Gait training, [phone]- Aquatic Therapy, [phone]- Ionotophoresis 4mg /ml Dexamethasone , Patient/Family education, Balance training, Stair training, Taping, Dry Needling, Joint mobilization, Spinal mobilization, Scar mobilization, Cryotherapy, and Moist heat.  PLAN FOR NEXT SESSION: recheck rotation, would like to work on [employer] C. Yoshimi Sarr PT, DPT 08/05/23 3:11 PM

## 2023-08-05 NOTE — Telephone Encounter (Signed)
 Dr. San, this is the [facility] patient we just discussed.  Please see phone message with significant thrombocytosis likely reactive in setting of suspected infectious gastroenteritis with UC.  Official lab result not yet entered in epic.  Let me know your recommendations regarding this preliminary result.  Thank you

## 2023-08-05 NOTE — Patient Instructions (Addendum)
 Your provider has requested that you go to the basement level for lab work before leaving today. Press B on the elevator. The lab is located at the first door on the left as you exit the elevator.  Your provider has ordered Diatherix stool testing for you. You have received a kit from our office today containing all necessary supplies to complete this test. Please carefully read the stool collection instructions provided in the kit before opening the accompanying materials. In addition, be sure to place the label from the top right corner of the laboratory request sheet onto the puritan opti-swab tube that is supplied in the kit. This label should include your full name and date of birth. After completing the test, you should secure the purtian tube into the specimen biohazard bag. The laboratory request information sheet (including date and time of specimen collection) should be placed into the outside pocket of the specimen biohazard bag and returned to the [place] lab with 2 days of collection.   If the laboratory information sheet specimen date and time are not filled out, the test will NOT be performed.  Drink more fluids  Go to the Emergency room if diarrhea get worst  _______________________________________________________  If your blood pressure at your visit was 140/90 or greater, please contact your primary care physician to follow up on this.  _______________________________________________________  If you are age 19 or older, your body mass index should be between 23-30. Your Body mass index is 17.31 kg/m. If this is out of the aforementioned range listed, please consider follow up with your Primary Care Provider.  If you are age 100 or younger, your body mass index should be between 19-25. Your Body mass index is 17.31 kg/m. If this is out of the aformentioned range listed, please consider follow up with your Primary Care Provider.    ________________________________________________________  The [facility] providers would like to encourage you to use MYCHART to communicate with providers for non-urgent requests or questions.  Due to long hold times on the telephone, sending your provider a message by [facility] may be a faster and more efficient way to get a response.  Please allow 48 business hours for a response.  Please remember that this is for non-urgent requests.  _______________________________________________________  Due to recent changes in healthcare laws, you may see the results of your imaging and laboratory studies on MyChart before your provider has had a chance to review them.  We understand that in some cases there may be results that are confusing or concerning to you. Not all laboratory results come back in the same time frame and the provider may be waiting for multiple results in order to interpret others.  Please give us  48 hours in order for your provider to thoroughly review all the results before contacting the office for clarification of your results.   Thank you for trusting me with your gastrointestinal care!   Elida Shawl, CRNP

## 2023-08-05 NOTE — Progress Notes (Signed)
 Agree with the assessment and plan as outlined by Alcide Evener, NP.   Doristine Locks, DO, [facility]

## 2023-08-05 NOTE — Telephone Encounter (Signed)
 Lab called with Critical value for platelets: 1,063 Please review and advise

## 2023-08-07 ENCOUNTER — Telehealth: Payer: Self-pay

## 2023-08-07 ENCOUNTER — Other Ambulatory Visit: Payer: Self-pay

## 2023-08-07 DIAGNOSIS — K51919 Ulcerative colitis, unspecified with unspecified complications: Secondary | ICD-10-CM

## 2023-08-07 DIAGNOSIS — R197 Diarrhea, unspecified: Secondary | ICD-10-CM

## 2023-08-07 NOTE — Telephone Encounter (Signed)
 Spoke with patient & she says she is doing much better today. Symptoms are improving. She received an IV infusion yesterday at a local infusion center. She will come in tomorrow for lab work.

## 2023-08-07 NOTE — Telephone Encounter (Signed)
 Contacted patient and patient verbalized understanding of negative result from Diatherix GI panel and cdiff stool test.

## 2023-08-07 NOTE — Telephone Encounter (Signed)
 Dr. Barron Alvine, FYI GI pathogen panel and C. Diff were negative.   Sammie Bench as there is another lab message circulating on this patient, pls make sure she gets a repeat CBC done today or tomorrow morning. THX.

## 2023-08-11 ENCOUNTER — Encounter (HOSPITAL_BASED_OUTPATIENT_CLINIC_OR_DEPARTMENT_OTHER): Payer: Self-pay | Admitting: Physical Therapy

## 2023-08-11 ENCOUNTER — Other Ambulatory Visit (HOSPITAL_BASED_OUTPATIENT_CLINIC_OR_DEPARTMENT_OTHER): Payer: Self-pay

## 2023-08-11 ENCOUNTER — Ambulatory Visit (HOSPITAL_BASED_OUTPATIENT_CLINIC_OR_DEPARTMENT_OTHER): Payer: Commercial Managed Care - PPO | Admitting: Physical Therapy

## 2023-08-11 ENCOUNTER — Other Ambulatory Visit: Payer: Self-pay | Admitting: Nurse Practitioner

## 2023-08-11 DIAGNOSIS — M25552 Pain in left hip: Secondary | ICD-10-CM

## 2023-08-11 DIAGNOSIS — M7632 Iliotibial band syndrome, left leg: Secondary | ICD-10-CM | POA: Diagnosis not present

## 2023-08-11 DIAGNOSIS — M6281 Muscle weakness (generalized): Secondary | ICD-10-CM

## 2023-08-11 MED ORDER — METRONIDAZOLE 500 MG PO TABS
500.0000 mg | ORAL_TABLET | Freq: Two times a day (BID) | ORAL | 0 refills | Status: AC
  Filled 2023-08-11: qty 28, 14d supply, fill #0

## 2023-08-11 MED ORDER — CIPROFLOXACIN HCL 500 MG PO TABS
500.0000 mg | ORAL_TABLET | Freq: Two times a day (BID) | ORAL | 0 refills | Status: AC
  Filled 2023-08-11: qty 28, 14d supply, fill #0

## 2023-08-11 NOTE — Therapy (Signed)
 OUTPATIENT PHYSICAL THERAPY EVALUATION   Patient Name: Brooke Mckee MRN: [id number] DOB:1990/10/02, 33 y.o., female Today's Date: 08/11/2023  END OF SESSION:  PT End of Session - 08/11/23 1143     Visit Number 2    Number of Visits 17    Date for PT Re-Evaluation 10/04/23    Authorization Type [facility] [employer]    PT Start Time 1143    PT Stop Time 1223    PT Time Calculation (min) 40 min    Activity Tolerance Patient tolerated treatment well    Behavior During Therapy [facility] for tasks assessed/performed             Past Medical History:  Diagnosis Date   Migraines    Pouchitis (HCC)    Ulcerative colitis (HCC)    Past Surgical History:  Procedure Laterality Date   BIOPSY  06/05/2021   Procedure: BIOPSY;  Surgeon: San Sandor GAILS, DO;  [facility];  Service: Gastroenterology;;   CESAREAN SECTION     CESAREAN SECTION N/A 07/11/2021   Procedure: CESAREAN SECTION;  Surgeon: Eveline Lynwood MATSU, MD;  [facility]: [facility];  Service: Obstetrics;  Laterality: N/A;   EXCISION MASS ABDOMINAL N/A 07/09/2023   Procedure: excision of abdominal adhesions;  Surgeon: Lowery Estefana RAMAN, DO;  Location: [facility];  Service: Plastics;  Laterality: N/A;   FLEXIBLE SIGMOIDOSCOPY N/A 06/05/2021   Procedure: FLEXIBLE SIGMOIDOSCOPY;  Surgeon: San Sandor GAILS, DO;  [facility];  Service: Gastroenterology;  Laterality: N/A;  unsedated procedure   ILEAL POUCH  2012   [place]   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 3 months)   Patient Active Problem List   Diagnosis Date Noted   Adhesion of abdominal wall 05/02/2023   Encounter for counseling 12/16/2022   IUD (intrauterine device) in place 10/01/2022   Pouchitis (HCC) 06/05/2021   Diarrhea of presumed infectious origin    Fibrocystic disease of breast 06/04/2021   Partial small bowel obstruction (HCC) 06/04/2021   History of C-section 04/17/2021   Migraine without status migrainosus, not intractable  09/12/2017   Ulcerative colitis with complication (HCC) 01/28/2017   Osteoporosis without current pathological fracture 01/28/2017    REFERRING PROVIDER:  Emiliano Leonce CROME, PA-C   REFERRING DIAG:  [phone] (ICD-10-CM) - It band syndrome, left    Rationale for Evaluation and Treatment: Rehabilitation  THERAPY DIAG:  Pain in left hip  Muscle weakness (generalized)  ONSET DATE: chronic   SUBJECTIVE:  SUBJECTIVE STATEMENT: Lack of energy.   PERTINENT HISTORY:  UC, OP, h/o C-section  PAIN:  Are you having pain? Yes: NPRS scale: mild-moderate  Pain location: Lt lateral hip Pain description: snap Aggravating factors: stair climber, squats Relieving factors: cortisone injection  PRECAUTIONS:  None  RED FLAGS: None   WEIGHT BEARING RESTRICTIONS:  No  FALLS:  Has patient fallen in last 6 months? No  PLOF:  Independent  PATIENT GOALS:  Do squats without pain.    OBJECTIVE:  Note: Objective measures were completed at Evaluation unless otherwise noted.  PATIENT SURVEYS:  N/a  COGNITIVE STATUS: Within functional limits for tasks assessed   POSTURE:  Eval: Lt PSIS elevation, Rt post innom rotation with functional LLD (Lt longer)  Lumbar dextroscoliosis    Eval: SLS trendelenburg on Right.                                                                                                                             TREATMENT DATE:   Treatment                            08/11/23:  Bridge with lower for core engagement- green tband around knees  Quadruped alt shoulder flexion, hip extension, bird dog Tall kneel glut set Tall kneel hinge with OH reach- incr Lt UE reach for pelvic alignment Tall kneeling squat arms OH Trigger Point Dry Needling  Subsequent Treatment:  Instructions provided previously at initial dry needling treatment.   Patient Verbal Consent Given: Yes Education Handout Provided: Previously Provided Muscles Treated: Lt glut med, min, TFL Electrical Stimulation Performed: No Treatment Response/Outcome: twitch with decreased tension into LE  Figure 4 stretch    Treatment                            08/05/23:  Hesch correction for Rt pot innom rotation Core contraction in hooklying Bridge with green tband at knees on heels Trigger Point Dry Needling  Initial Treatment: Pt instructed on Dry Needling rational, procedures, and possible side effects. Pt instructed to expect mild to moderate muscle soreness later in the day and/or into the next day.  Pt instructed in methods to reduce muscle soreness. Pt instructed to continue prescribed HEP. Patient verbalized understanding of these instructions and education.   Patient Verbal Consent Given: Yes Education Handout Provided: Yes Muscles Treated: Lt glut med/min/TFL, piriformis Electrical Stimulation Performed: No Treatment Response/Outcome: twitch with reduced concordant symptoms      PATIENT EDUCATION:  Education details: Teacher, Music of condition, POC, HEP, exercise form/rationale Person educated: Patient Education method: Explanation, Demonstration, Tactile cues, Verbal cues, and Handouts Education comprehension: verbalized understanding, returned demonstration, verbal cues required, tactile cues required, and needs further education  HOME EXERCISE PROGRAM: [id number] Requested upright bike for cardio (in place of stair stepper)   ASSESSMENT:  CLINICAL IMPRESSION: Notable tendency for hips to shift  to the right in tall kneeling position resulting in Lt abductor shortening & Rt lengthening, able to correct posture with verbal & visual cues. Decreased snapping intensity following DN. Unable to be on reformer today, will check schedule before next appt.     REHAB POTENTIAL:  Good  CLINICAL DECISION MAKING: Evolving/moderate complexity  EVALUATION COMPLEXITY: Moderate   GOALS: Goals reviewed with patient? Yes  SHORT TERM GOALS: Target date: 2/1  Presents with level pelvis for resolution of functional LLD  Goal status: INITIAL  2.  Cardio in gym without snapping  Goal status: INITIAL   LONG TERM GOALS: Target date: POC date  Independent in HEP for lumbopelvic strength and stability   Goal status: INITIAL  2.  Able to perform squatting motion for functional needs without hip pain  Goal status: INITIAL  3.  Good control with dynamic balance activities to reduce risk of fall resulting in fx  Goal status: INITIAL     PLAN:  PT FREQUENCY: 1-2x/week  PT DURATION: 8 weeks  PLANNED INTERVENTIONS: 97164- PT Re-evaluation, 97110-Therapeutic exercises, 97530- Therapeutic activity, 97112- Neuromuscular re-education, 97535- Self Care, 02859- Manual therapy, [phone]- Gait training, [phone]- Aquatic Therapy, [phone]- Ionotophoresis 4mg /ml Dexamethasone , Patient/Family education, Balance training, Stair training, Taping, Dry Needling, Joint mobilization, Spinal mobilization, Scar mobilization, Cryotherapy, and Moist heat.  PLAN FOR NEXT SESSION: recheck rotation, would like to work on [employer] C. Silvina Hackleman PT, DPT 08/11/23 12:35 PM

## 2023-08-11 NOTE — Telephone Encounter (Signed)
 Dr. San, I contacted the patient and she is aware of your plan as noted below.  I sent prescriptions for Flagyl  500 mg 1 tab twice daily x 14 days and Cipro  500 mg twice daily for 14 days.  She will start a probiotic.  She will return to our lab tomorrow for the CBC which was previously ordered.  She will contact me in 2 days with further update and we can determine the necessity of Prednisone and pouchoscopy at that time.

## 2023-08-11 NOTE — Telephone Encounter (Signed)
 Dr. Barron Alvine, pls see message below. Do you recommend a course of Xifaxan or Prednisone or flex sig? Please let me know your input and I will contact patient. Thanks.

## 2023-08-12 ENCOUNTER — Encounter: Payer: Self-pay | Admitting: Nurse Practitioner

## 2023-08-12 ENCOUNTER — Ambulatory Visit (HOSPITAL_BASED_OUTPATIENT_CLINIC_OR_DEPARTMENT_OTHER): Payer: Commercial Managed Care - PPO | Admitting: Physical Therapy

## 2023-08-12 ENCOUNTER — Encounter (HOSPITAL_BASED_OUTPATIENT_CLINIC_OR_DEPARTMENT_OTHER): Payer: Self-pay | Admitting: Physical Therapy

## 2023-08-12 ENCOUNTER — Other Ambulatory Visit (INDEPENDENT_AMBULATORY_CARE_PROVIDER_SITE_OTHER): Payer: Commercial Managed Care - PPO

## 2023-08-12 DIAGNOSIS — K51919 Ulcerative colitis, unspecified with unspecified complications: Secondary | ICD-10-CM | POA: Diagnosis not present

## 2023-08-12 DIAGNOSIS — M6281 Muscle weakness (generalized): Secondary | ICD-10-CM

## 2023-08-12 DIAGNOSIS — M25552 Pain in left hip: Secondary | ICD-10-CM

## 2023-08-12 DIAGNOSIS — R197 Diarrhea, unspecified: Secondary | ICD-10-CM

## 2023-08-12 DIAGNOSIS — M7632 Iliotibial band syndrome, left leg: Secondary | ICD-10-CM | POA: Diagnosis not present

## 2023-08-12 LAB — CBC
HCT: 28 % — ABNORMAL LOW (ref 36.0–46.0)
Hemoglobin: 8.4 g/dL — ABNORMAL LOW (ref 12.0–15.0)
MCHC: 30 g/dL (ref 30.0–36.0)
MCV: 60.9 fL — ABNORMAL LOW (ref 78.0–100.0)
Platelets: 896 10*3/uL — ABNORMAL HIGH (ref 150.0–400.0)
RBC: 4.59 Mil/uL (ref 3.87–5.11)
RDW: 20.7 % — ABNORMAL HIGH (ref 11.5–15.5)
WBC: 7.7 10*3/uL (ref 4.0–10.5)

## 2023-08-12 NOTE — Therapy (Signed)
 OUTPATIENT PHYSICAL THERAPY EVALUATION   Patient Name: Brooke Mckee MRN: [id number] DOB:1990/09/11, 33 y.o., female Today's Date: 08/12/2023  END OF SESSION:  PT End of Session - 08/12/23 1309     Visit Number 3    Number of Visits 17    Date for PT Re-Evaluation 10/04/23    Authorization Type [facility] [employer]    PT Start Time 1230    PT Stop Time 1300    PT Time Calculation (min) 30 min    Activity Tolerance Patient tolerated treatment well    Behavior During Therapy [facility] for tasks assessed/performed              Past Medical History:  Diagnosis Date   Migraines    Pouchitis (HCC)    Ulcerative colitis (HCC)    Past Surgical History:  Procedure Laterality Date   BIOPSY  06/05/2021   Procedure: BIOPSY;  Surgeon: San Sandor GAILS, DO;  [facility];  Service: Gastroenterology;;   CESAREAN SECTION     CESAREAN SECTION N/A 07/11/2021   Procedure: CESAREAN SECTION;  Surgeon: Eveline Lynwood MATSU, MD;  [facility];  Service: Obstetrics;  Laterality: N/A;   EXCISION MASS ABDOMINAL N/A 07/09/2023   Procedure: excision of abdominal adhesions;  Surgeon: Lowery Estefana RAMAN, DO;  Location: [facility];  Service: Plastics;  Laterality: N/A;   FLEXIBLE SIGMOIDOSCOPY N/A 06/05/2021   Procedure: FLEXIBLE SIGMOIDOSCOPY;  Surgeon: San Sandor GAILS, DO;  [facility];  Service: Gastroenterology;  Laterality: N/A;  unsedated procedure   ILEAL POUCH  2012   [place]   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 3 months)   Patient Active Problem List   Diagnosis Date Noted   Adhesion of abdominal wall 05/02/2023   Encounter for counseling 12/16/2022   IUD (intrauterine device) in place 10/01/2022   Pouchitis (HCC) 06/05/2021   Diarrhea of presumed infectious origin    Fibrocystic disease of breast 06/04/2021   Partial small bowel obstruction (HCC) 06/04/2021   History of C-section 04/17/2021   Migraine without status migrainosus, not  intractable 09/12/2017   Ulcerative colitis with complication (HCC) 01/28/2017   Osteoporosis without current pathological fracture 01/28/2017    REFERRING PROVIDER:  Emiliano Leonce CROME, PA-C   REFERRING DIAG:  [phone] (ICD-10-CM) - It band syndrome, left    Rationale for Evaluation and Treatment: Rehabilitation  THERAPY DIAG:  Pain in left hip  Muscle weakness (generalized)  ONSET DATE: chronic   SUBJECTIVE:  SUBJECTIVE STATEMENT: Lack of energy.   PERTINENT HISTORY:  UC, OP, h/o C-section  PAIN:  Are you having pain? Yes: NPRS scale: mild-moderate  Pain location: Lt lateral hip Pain description: snap Aggravating factors: stair climber, squats Relieving factors: cortisone injection  PRECAUTIONS:  None  RED FLAGS: None   WEIGHT BEARING RESTRICTIONS:  No  FALLS:  Has patient fallen in last 6 months? No  PLOF:  Independent  PATIENT GOALS:  Do squats without pain.    OBJECTIVE:  Note: Objective measures were completed at Evaluation unless otherwise noted.  PATIENT SURVEYS:  N/a  COGNITIVE STATUS: Within functional limits for tasks assessed   POSTURE:  Eval: Lt PSIS elevation, Rt post innom rotation with functional LLD (Lt longer)  Lumbar dextroscoliosis    Eval: SLS trendelenburg on Right.                                                                                                                             TREATMENT DATE:   Treatment                            08/12/23:  Reformer 2 red foot work on bar 1R1B bridging 1R1B sidelying press Short box 1R 1B Tall kneel hands in straps- facing straps, 1R1B- straight arm pull down, wide row, triceps   Treatment                            08/11/23:  Bridge with lower for core engagement- green tband around  knees  Quadruped alt shoulder flexion, hip extension, bird dog Tall kneel glut set Tall kneel hinge with OH reach- incr Lt UE reach for pelvic alignment Tall kneeling squat arms OH Trigger Point Dry Needling  Subsequent Treatment: Instructions provided previously at initial dry needling treatment.   Patient Verbal Consent Given: Yes Education Handout Provided: Previously Provided Muscles Treated: Lt glut med, min, TFL Electrical Stimulation Performed: No Treatment Response/Outcome: twitch with decreased tension into LE  Figure 4 stretch    Treatment                            08/05/23:  Hesch correction for Rt pot innom rotation Core contraction in hooklying Bridge with green tband at knees on heels Trigger Point Dry Needling  Initial Treatment: Pt instructed on Dry Needling rational, procedures, and possible side effects. Pt instructed to expect mild to moderate muscle soreness later in the day and/or into the next day.  Pt instructed in methods to reduce muscle soreness. Pt instructed to continue prescribed HEP. Patient verbalized understanding of these instructions and education.   Patient Verbal Consent Given: Yes Education Handout Provided: Yes Muscles Treated: Lt glut med/min/TFL, piriformis Electrical Stimulation Performed: No Treatment Response/Outcome: twitch with reduced concordant symptoms      PATIENT EDUCATION:  Education details:  Anatomy of condition, POC, HEP, exercise form/rationale Person educated: Patient Education method: Explanation, Demonstration, Tactile cues, Verbal cues, and Handouts Education comprehension: verbalized understanding, returned demonstration, verbal cues required, tactile cues required, and needs further education  HOME EXERCISE PROGRAM: [id number] Requested upright bike for cardio (in place of stair stepper)   ASSESSMENT:  CLINICAL IMPRESSION: Ended at 30 min for fatigue. Only snapping felt in bridging but resolved with low  bridge stop. Focused on core control with glut activation using reformer with good tolerance, cues for form provided.     REHAB POTENTIAL: Good  CLINICAL DECISION MAKING: Evolving/moderate complexity  EVALUATION COMPLEXITY: Moderate   GOALS: Goals reviewed with patient? Yes  SHORT TERM GOALS: Target date: 2/1  Presents with level pelvis for resolution of functional LLD  Goal status: INITIAL  2.  Cardio in gym without snapping  Goal status: INITIAL   LONG TERM GOALS: Target date: POC date  Independent in HEP for lumbopelvic strength and stability   Goal status: INITIAL  2.  Able to perform squatting motion for functional needs without hip pain  Goal status: INITIAL  3.  Good control with dynamic balance activities to reduce risk of fall resulting in fx  Goal status: INITIAL     PLAN:  PT FREQUENCY: 1-2x/week  PT DURATION: 8 weeks  PLANNED INTERVENTIONS: 97164- PT Re-evaluation, 97110-Therapeutic exercises, 97530- Therapeutic activity, 97112- Neuromuscular re-education, 97535- Self Care, 02859- Manual therapy, [id number]- Gait training, [phone]- Aquatic Therapy, [phone]- Ionotophoresis 4mg /ml Dexamethasone , Patient/Family education, Balance training, Stair training, Taping, Dry Needling, Joint mobilization, Spinal mobilization, Scar mobilization, Cryotherapy, and Moist heat.  PLAN FOR NEXT SESSION: recheck rotation, would like to work on [employer] C. Ruchy Wildrick PT, DPT 08/12/23 1:13 PM

## 2023-08-13 ENCOUNTER — Other Ambulatory Visit: Payer: Self-pay

## 2023-08-13 DIAGNOSIS — D509 Iron deficiency anemia, unspecified: Secondary | ICD-10-CM

## 2023-08-13 DIAGNOSIS — R197 Diarrhea, unspecified: Secondary | ICD-10-CM

## 2023-08-13 DIAGNOSIS — D649 Anemia, unspecified: Secondary | ICD-10-CM

## 2023-08-13 DIAGNOSIS — R7989 Other specified abnormal findings of blood chemistry: Secondary | ICD-10-CM

## 2023-08-15 ENCOUNTER — Telehealth: Payer: Self-pay | Admitting: Nurse Practitioner

## 2023-08-15 DIAGNOSIS — D509 Iron deficiency anemia, unspecified: Secondary | ICD-10-CM

## 2023-08-15 DIAGNOSIS — R197 Diarrhea, unspecified: Secondary | ICD-10-CM

## 2023-08-15 DIAGNOSIS — K51919 Ulcerative colitis, unspecified with unspecified complications: Secondary | ICD-10-CM

## 2023-08-15 NOTE — Telephone Encounter (Signed)
I called the patient today for symptom. She stated feeling "one thousand  times better". She took 1 dose of Flagyl and tried to take a second and third dose but vomited the Flagyl tablet back up.  She stated the Flagyl tablet is chalky and gets stuck in her throat or esophagus which results in gagging and she vomits to get the pill out.  She stated having the same problem with Flagyl in the past. She remains on Cipro 500 mg twice daily.  She stated her diarrhea frequency and abdominal pain significantly improved within 24 hours after starting Cipro + one dose of Flagyl. No further nausea or vomiting.  She will contact our office if her proctitis symptoms linger or worsen.  Dr. Barron Alvine, [facility]

## 2023-08-18 NOTE — Addendum Note (Signed)
Addended by: Lamona Curl on: 08/18/2023 04:33 PM   Modules accepted: Orders

## 2023-08-18 NOTE — Telephone Encounter (Signed)
Patient aware that she is scheduled for flexible sigmoidoscopy on 10-06-23 at 9:00am.  Prep instructions sent to patient in MyChart.  Patient agreed to plan and verbalized understanding.

## 2023-08-19 ENCOUNTER — Ambulatory Visit (HOSPITAL_BASED_OUTPATIENT_CLINIC_OR_DEPARTMENT_OTHER): Payer: Commercial Managed Care - PPO | Admitting: Physical Therapy

## 2023-08-19 ENCOUNTER — Encounter (HOSPITAL_BASED_OUTPATIENT_CLINIC_OR_DEPARTMENT_OTHER): Payer: Commercial Managed Care - PPO | Admitting: Physical Therapy

## 2023-08-19 ENCOUNTER — Encounter (HOSPITAL_BASED_OUTPATIENT_CLINIC_OR_DEPARTMENT_OTHER): Payer: Self-pay | Admitting: Physical Therapy

## 2023-08-19 ENCOUNTER — Encounter: Payer: Self-pay | Admitting: Plastic Surgery

## 2023-08-19 ENCOUNTER — Ambulatory Visit (INDEPENDENT_AMBULATORY_CARE_PROVIDER_SITE_OTHER): Payer: Commercial Managed Care - PPO | Admitting: Plastic Surgery

## 2023-08-19 VITALS — BP 109/67 | HR 100 | Ht 67.0 in | Wt 116.6 lb

## 2023-08-19 DIAGNOSIS — K660 Peritoneal adhesions (postprocedural) (postinfection): Secondary | ICD-10-CM

## 2023-08-19 DIAGNOSIS — M6281 Muscle weakness (generalized): Secondary | ICD-10-CM | POA: Diagnosis not present

## 2023-08-19 DIAGNOSIS — M25552 Pain in left hip: Secondary | ICD-10-CM | POA: Diagnosis not present

## 2023-08-19 DIAGNOSIS — M7632 Iliotibial band syndrome, left leg: Secondary | ICD-10-CM | POA: Diagnosis not present

## 2023-08-19 NOTE — Progress Notes (Signed)
   Subjective:    Patient ID: Brooke Mckee, female    DOB: 1990/11/06, 32 y.o.   MRN: [id number]  The patient underwent release and excision of abdominal adhesions on December 11.  These were from surgery done many years ago.  The area is showing remarkable improvement without any indentation.  The patient says she noticed a little bit of a excess on the ends of the incision.  If that does not improve that could be addressed in the office.  No tenderness and no sign of seroma or hematoma.      Review of Systems  Constitutional: Negative.   Eyes: Negative.   Respiratory: Negative.    Cardiovascular: Negative.   Gastrointestinal: Negative.   Endocrine: Negative.   Genitourinary: Negative.   Musculoskeletal: Negative.        Objective:   Physical Exam Vitals reviewed.  Constitutional:      Appearance: Normal appearance.  HENT:     Head: Atraumatic.  Cardiovascular:     Rate and Rhythm: Normal rate.     Pulses: Normal pulses.  Pulmonary:     Effort: Pulmonary effort is normal.  Abdominal:     General: There is no distension.     Palpations: Abdomen is soft.     Tenderness: There is no abdominal tenderness.  Skin:    General: Skin is warm.     Capillary Refill: Capillary refill takes less than 2 seconds.  Neurological:     Mental Status: She is alert and oriented to person, place, and time.  Psychiatric:        Mood and Affect: Mood normal.        Behavior: Behavior normal.        Thought Content: Thought content normal.        Judgment: Judgment normal.        Assessment & Plan:     ICD-10-CM   1. Adhesion of abdominal wall  K66.0     Continue massage and I did recommend a scar cream and massage.  Follow-up as needed.  Pictures were obtained of the patient and placed in the chart with the patient's or guardian's permission.

## 2023-08-19 NOTE — Therapy (Signed)
OUTPATIENT PHYSICAL THERAPY EVALUATION   Patient Name: Brooke Mckee MRN: [id number] DOB:1991/05/23, 33 y.o., female Today's Date: 08/19/2023  END OF SESSION:  PT End of Session - 08/19/23 1143     Visit Number 4    Number of Visits 17    Date for PT Re-Evaluation 10/04/23    Authorization Type [facility] [employer]    PT Start Time 1143    PT Stop Time 1230    PT Time Calculation (min) 47 min    Activity Tolerance Patient tolerated treatment well    Behavior During Therapy [facility] for tasks assessed/performed               Past Medical History:  Diagnosis Date   Migraines    Pouchitis (HCC)    Ulcerative colitis (HCC)    Past Surgical History:  Procedure Laterality Date   BIOPSY  06/05/2021   Procedure: BIOPSY;  Surgeon: Shellia Cleverly, DO;  [facility];  Service: Gastroenterology;;   CESAREAN SECTION     CESAREAN SECTION N/A 07/11/2021   Procedure: CESAREAN SECTION;  Surgeon: Adam Phenix, MD;  [facility]: [facility];  Service: Obstetrics;  Laterality: N/A;   EXCISION MASS ABDOMINAL N/A 07/09/2023   Procedure: excision of abdominal adhesions;  Surgeon: Peggye Form, DO;  Location: [facility];  Service: Plastics;  Laterality: N/A;   FLEXIBLE SIGMOIDOSCOPY N/A 06/05/2021   Procedure: FLEXIBLE SIGMOIDOSCOPY;  Surgeon: Shellia Cleverly, DO;  [facility];  Service: Gastroenterology;  Laterality: N/A;  unsedated procedure   ILEAL POUCH  2012   [place]   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 3 months)   Patient Active Problem List   Diagnosis Date Noted   Adhesion of abdominal wall 05/02/2023   Encounter for counseling 12/16/2022   IUD (intrauterine device) in place 10/01/2022   Pouchitis (HCC) 06/05/2021   Diarrhea of presumed infectious origin    Fibrocystic disease of breast 06/04/2021   Partial small bowel obstruction (HCC) 06/04/2021   History of C-section 04/17/2021   Migraine without status migrainosus, not  intractable 09/12/2017   Ulcerative colitis with complication (HCC) 01/28/2017   Osteoporosis without current pathological fracture 01/28/2017    REFERRING PROVIDER:  Amador Cunas, PA-C   REFERRING DIAG:  [phone] (ICD-10-CM) - It band syndrome, left    Rationale for Evaluation and Treatment: Rehabilitation  THERAPY DIAG:  Pain in left hip  Muscle weakness (generalized)  ONSET DATE: chronic   SUBJECTIVE:  SUBJECTIVE STATEMENT: Atrophy in quads.   PERTINENT HISTORY:  UC, OP, h/o C-section  PAIN:  Are you having pain? Yes: NPRS scale: mild-moderate  Pain location: Lt lateral hip Pain description: snap Aggravating factors: stair climber, squats Relieving factors: cortisone injection  PRECAUTIONS:  None  RED FLAGS: None   WEIGHT BEARING RESTRICTIONS:  No  FALLS:  Has patient fallen in last 6 months? No  PLOF:  Independent  PATIENT GOALS:  Do squats without pain.    OBJECTIVE:  Note: Objective measures were completed at Evaluation unless otherwise noted.  PATIENT SURVEYS:  N/a  COGNITIVE STATUS: Within functional limits for tasks assessed   POSTURE:  Eval: Lt PSIS elevation, Rt post innom rotation with functional LLD (Lt longer)  Lumbar dextroscoliosis    Eval: SLS trendelenburg on Right.                                                                                                                             TREATMENT DATE:   Treatment                            08/19/23:  Trigger Point Dry Needling  Subsequent Treatment: Instructions provided previously at initial dry needling treatment.   Patient Verbal Consent Given: Yes Education Handout Provided: Previously Provided Muscles Treated: Lt glut med/min, TFL Electrical Stimulation Performed:  No Treatment Response/Outcome: twitch with decreased tension  Pilates reformer:  Supine foot work 2R1B Feet in straps in supine Sidelying foot in strap- unable to tolerate with Left foot in strap Supine hands in straps Step up with glut activation- snapping at end of hip ext in stepping up.    Treatment                            08/12/23:  Reformer 2 red foot work on bar 1R1B bridging 1R1B sidelying press Short box 1R 1B Tall kneel hands in straps- facing straps, 1R1B- straight arm pull down, wide row, triceps   Treatment                            08/11/23:  Bridge with lower for core engagement- green tband around knees  Quadruped alt shoulder flexion, hip extension, bird dog Tall kneel glut set Tall kneel hinge with OH reach- incr Lt UE reach for pelvic alignment Tall kneeling squat arms OH Trigger Point Dry Needling  Subsequent Treatment: Instructions provided previously at initial dry needling treatment.   Patient Verbal Consent Given: Yes Education Handout Provided: Previously Provided Muscles Treated: Lt glut med, min, TFL Electrical Stimulation Performed: No Treatment Response/Outcome: twitch with decreased tension into LE  Figure 4 stretch    PATIENT EDUCATION:  Education details: Anatomy of condition, POC, HEP, exercise form/rationale Person educated: Patient Education method: Explanation, Demonstration, Tactile cues, Verbal cues, and Handouts Education comprehension:  verbalized understanding, returned demonstration, verbal cues required, tactile cues required, and needs further education  HOME EXERCISE PROGRAM: [id number] Requested upright bike for cardio (in place of stair stepper)   ASSESSMENT:  CLINICAL IMPRESSION: Good tolerance to progression on reformer. Instability when left foot was independent in strap in sidelying.     REHAB POTENTIAL: Good  CLINICAL DECISION MAKING: Evolving/moderate complexity  EVALUATION COMPLEXITY:  Moderate   GOALS: Goals reviewed with patient? Yes  SHORT TERM GOALS: Target date: 2/1  Presents with level pelvis for resolution of functional LLD  Goal status: INITIAL  2.  Cardio in gym without snapping  Goal status: INITIAL   LONG TERM GOALS: Target date: POC date  Independent in HEP for lumbopelvic strength and stability   Goal status: INITIAL  2.  Able to perform squatting motion for functional needs without hip pain  Goal status: INITIAL  3.  Good control with dynamic balance activities to reduce risk of fall resulting in fx  Goal status: INITIAL     PLAN:  PT FREQUENCY: 1-2x/week  PT DURATION: 8 weeks  PLANNED INTERVENTIONS: 97164- PT Re-evaluation, 97110-Therapeutic exercises, 97530- Therapeutic activity, 97112- Neuromuscular re-education, 97535- Self Care, 40981- Manual therapy, [phone]- Gait training, [phone]- Aquatic Therapy, [phone]- Ionotophoresis 4mg /ml Dexamethasone, Patient/Family education, Balance training, Stair training, Taping, Dry Needling, Joint mobilization, Spinal mobilization, Scar mobilization, Cryotherapy, and Moist heat.  PLAN FOR NEXT SESSION: recheck rotation, would like to work on [employer] C. Ashtian Villacis PT, DPT 08/19/23 1:52 PM

## 2023-08-22 ENCOUNTER — Ambulatory Visit (HOSPITAL_BASED_OUTPATIENT_CLINIC_OR_DEPARTMENT_OTHER): Payer: Commercial Managed Care - PPO | Admitting: Physical Therapy

## 2023-08-22 ENCOUNTER — Encounter (HOSPITAL_BASED_OUTPATIENT_CLINIC_OR_DEPARTMENT_OTHER): Payer: Self-pay | Admitting: Physical Therapy

## 2023-08-22 DIAGNOSIS — M25552 Pain in left hip: Secondary | ICD-10-CM

## 2023-08-22 DIAGNOSIS — M6281 Muscle weakness (generalized): Secondary | ICD-10-CM

## 2023-08-22 DIAGNOSIS — M7632 Iliotibial band syndrome, left leg: Secondary | ICD-10-CM | POA: Diagnosis not present

## 2023-08-22 NOTE — Therapy (Signed)
OUTPATIENT PHYSICAL THERAPY EVALUATION   Patient Name: Brooke Mckee MRN: [id number] DOB:03-27-1991, 33 y.o., female Today's Date: 08/22/2023  END OF SESSION:  PT End of Session - 08/22/23 1012     Visit Number 5    Number of Visits 17    Date for PT Re-Evaluation 10/04/23    Authorization Type [facility] [employer]    PT Start Time 1013    PT Stop Time 1059    PT Time Calculation (min) 46 min    Activity Tolerance Patient tolerated treatment well    Behavior During Therapy WFL for tasks assessed/performed                Past Medical History:  Diagnosis Date   Migraines    Pouchitis (HCC)    Ulcerative colitis (HCC)    Past Surgical History:  Procedure Laterality Date   BIOPSY  06/05/2021   Procedure: BIOPSY;  Surgeon: Shellia Cleverly, DO;  [facility];  Service: Gastroenterology;;   CESAREAN SECTION     CESAREAN SECTION N/A 07/11/2021   Procedure: CESAREAN SECTION;  Surgeon: Adam Phenix, MD;  [facility];  Service: Obstetrics;  Laterality: N/A;   EXCISION MASS ABDOMINAL N/A 07/09/2023   Procedure: excision of abdominal adhesions;  Surgeon: Peggye Form, DO;  Location: [facility];  Service: Plastics;  Laterality: N/A;   FLEXIBLE SIGMOIDOSCOPY N/A 06/05/2021   Procedure: FLEXIBLE SIGMOIDOSCOPY;  Surgeon: Shellia Cleverly, DO;  [facility];  Service: Gastroenterology;  Laterality: N/A;  unsedated procedure   ILEAL POUCH  2012   [place]   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 3 months)   Patient Active Problem List   Diagnosis Date Noted   Adhesion of abdominal wall 05/02/2023   Encounter for counseling 12/16/2022   IUD (intrauterine device) in place 10/01/2022   Pouchitis (HCC) 06/05/2021   Diarrhea of presumed infectious origin    Fibrocystic disease of breast 06/04/2021   Partial small bowel obstruction (HCC) 06/04/2021   History of C-section 04/17/2021   Migraine without status migrainosus, not  intractable 09/12/2017   Ulcerative colitis with complication (HCC) 01/28/2017   Osteoporosis without current pathological fracture 01/28/2017    REFERRING PROVIDER:  Amador Cunas, PA-C   REFERRING DIAG:  [phone] (ICD-10-CM) - It band syndrome, left    Rationale for Evaluation and Treatment: Rehabilitation  THERAPY DIAG:  Pain in left hip  Muscle weakness (generalized)  ONSET DATE: chronic   SUBJECTIVE:  SUBJECTIVE STATEMENT: A little sore, a lot of popping after the last session   PERTINENT HISTORY:  UC, OP, h/o C-section  PAIN:  Are you having pain? Yes: NPRS scale: mild-moderate  Pain location: Lt lateral hip Pain description: snap Aggravating factors: stair climber, squats Relieving factors: cortisone injection  PRECAUTIONS:  None  RED FLAGS: None   WEIGHT BEARING RESTRICTIONS:  No  FALLS:  Has patient fallen in last 6 months? No  PLOF:  Independent  PATIENT GOALS:  Do squats without pain.    OBJECTIVE:  Note: Objective measures were completed at Evaluation unless otherwise noted.  PATIENT SURVEYS:  N/a  COGNITIVE STATUS: Within functional limits for tasks assessed   POSTURE:  Eval: Lt PSIS elevation, Rt post innom rotation with functional LLD (Lt longer)  Lumbar dextroscoliosis    Eval: SLS trendelenburg on Right.                                                                                                                             TREATMENT DATE:   Treatment                            1/24:  Trigger Point Dry Needling  Subsequent Treatment: Instructions provided previously at initial dry needling treatment.   Patient Verbal Consent Given: Yes Education Handout Provided: Previously Provided Muscles Treated: Lt hip- glut med/min,  piriformis Electrical Stimulation Performed: No Treatment Response/Outcome: twitch with decreased concordant pain  STM around hip Ktape support TFL & glut med Clam+reverse & lower Wide bridge Sit<>stand ball bw knees- + hip hinge SLS with mini squat stairs   Treatment                            08/19/23:  Trigger Point Dry Needling  Subsequent Treatment: Instructions provided previously at initial dry needling treatment.   Patient Verbal Consent Given: Yes Education Handout Provided: Previously Provided Muscles Treated: Lt glut med/min, TFL Electrical Stimulation Performed: No Treatment Response/Outcome: twitch with decreased tension  Pilates reformer:  Supine foot work 2R1B Feet in straps in supine Sidelying foot in strap- unable to tolerate with Left foot in strap Supine hands in straps Step up with glut activation- snapping at end of hip ext in stepping up.    Treatment                            08/12/23:  Reformer 2 red foot work on bar 1R1B bridging 1R1B sidelying press Short box 1R 1B Tall kneel hands in straps- facing straps, 1R1B- straight arm pull down, wide row, triceps    PATIENT EDUCATION:  Education details: Anatomy of condition, POC, HEP, exercise form/rationale Person educated: Patient Education method: Explanation, Demonstration, Tactile cues, Verbal cues, and Handouts Education comprehension: verbalized understanding, returned demonstration, verbal cues required, tactile cues required, and  needs further education  HOME EXERCISE PROGRAM: [id number] Requested upright bike for cardio (in place of stair stepper)   ASSESSMENT:  CLINICAL IMPRESSION: Able to reduce clicking in stairs with glut activation & level pelvis in Lt step up. Singificant work done to post hip with DN and STM but continued to have other clicking occasionally that was not painful- indicative of scar tissue. Limited hip hinge as expected with core weakness and will continue to  address.     REHAB POTENTIAL: Good  CLINICAL DECISION MAKING: Evolving/moderate complexity  EVALUATION COMPLEXITY: Moderate   GOALS: Goals reviewed with patient? Yes  SHORT TERM GOALS: Target date: 2/1  Presents with level pelvis for resolution of functional LLD  Goal status: INITIAL  2.  Cardio in gym without snapping  Goal status: INITIAL   LONG TERM GOALS: Target date: POC date  Independent in HEP for lumbopelvic strength and stability   Goal status: INITIAL  2.  Able to perform squatting motion for functional needs without hip pain  Goal status: INITIAL  3.  Good control with dynamic balance activities to reduce risk of fall resulting in fx  Goal status: INITIAL     PLAN:  PT FREQUENCY: 1-2x/week  PT DURATION: 8 weeks  PLANNED INTERVENTIONS: 97164- PT Re-evaluation, 97110-Therapeutic exercises, 97530- Therapeutic activity, 97112- Neuromuscular re-education, 97535- Self Care, 16109- Manual therapy, [phone]- Gait training, [phone]- Aquatic Therapy, [phone]- Ionotophoresis 4mg /ml Dexamethasone, Patient/Family education, Balance training, Stair training, Taping, Dry Needling, Joint mobilization, Spinal mobilization, Scar mobilization, Cryotherapy, and Moist heat.  PLAN FOR NEXT SESSION: recheck rotation, would like to work on [employer] C. Vonetta Foulk PT, DPT 08/22/23 11:01 AM

## 2023-08-26 ENCOUNTER — Encounter (HOSPITAL_BASED_OUTPATIENT_CLINIC_OR_DEPARTMENT_OTHER): Payer: Self-pay

## 2023-08-26 ENCOUNTER — Ambulatory Visit (HOSPITAL_BASED_OUTPATIENT_CLINIC_OR_DEPARTMENT_OTHER): Payer: Commercial Managed Care - PPO | Admitting: Physical Therapy

## 2023-08-29 ENCOUNTER — Ambulatory Visit (HOSPITAL_BASED_OUTPATIENT_CLINIC_OR_DEPARTMENT_OTHER): Payer: Commercial Managed Care - PPO | Admitting: Physical Therapy

## 2023-09-03 ENCOUNTER — Ambulatory Visit (HOSPITAL_BASED_OUTPATIENT_CLINIC_OR_DEPARTMENT_OTHER): Payer: Commercial Managed Care - PPO | Attending: Student | Admitting: Physical Therapy

## 2023-09-03 DIAGNOSIS — M25552 Pain in left hip: Secondary | ICD-10-CM | POA: Diagnosis not present

## 2023-09-03 DIAGNOSIS — M6281 Muscle weakness (generalized): Secondary | ICD-10-CM | POA: Diagnosis not present

## 2023-09-04 ENCOUNTER — Encounter (HOSPITAL_BASED_OUTPATIENT_CLINIC_OR_DEPARTMENT_OTHER): Payer: Self-pay | Admitting: Physical Therapy

## 2023-09-04 NOTE — Therapy (Signed)
 OUTPATIENT PHYSICAL THERAPY EVALUATION   Patient Name: Brooke Mckee MRN: [id number] DOB:May 17, 1991, 33 y.o., female Today's Date: 09/04/2023  END OF SESSION:  PT End of Session - 09/04/23 1223     Visit Number 6    Number of Visits 17    Date for PT Re-Evaluation 10/04/23    Authorization Type [facility] [employer]    PT Start Time 1345    PT Stop Time 1428    PT Time Calculation (min) 43 min    Activity Tolerance Patient tolerated treatment well    Behavior During Therapy WFL for tasks assessed/performed                Past Medical History:  Diagnosis Date   Migraines    Pouchitis (HCC)    Ulcerative colitis (HCC)    Past Surgical History:  Procedure Laterality Date   BIOPSY  06/05/2021   Procedure: BIOPSY;  Surgeon: San Sandor GAILS, DO;  [facility];  Service: Gastroenterology;;   CESAREAN SECTION     CESAREAN SECTION N/A 07/11/2021   Procedure: CESAREAN SECTION;  Surgeon: Eveline Lynwood MATSU, MD;  [facility];  Service: Obstetrics;  Laterality: N/A;   EXCISION MASS ABDOMINAL N/A 07/09/2023   Procedure: excision of abdominal adhesions;  Surgeon: Lowery Estefana RAMAN, DO;  Location: [facility];  Service: Plastics;  Laterality: N/A;   FLEXIBLE SIGMOIDOSCOPY N/A 06/05/2021   Procedure: FLEXIBLE SIGMOIDOSCOPY;  Surgeon: San Sandor GAILS, DO;  [facility];  Service: Gastroenterology;  Laterality: N/A;  unsedated procedure   ILEAL POUCH  2012   [place]   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 3 months)   Patient Active Problem List   Diagnosis Date Noted   Adhesion of abdominal wall 05/02/2023   Encounter for counseling 12/16/2022   IUD (intrauterine device) in place 10/01/2022   Pouchitis (HCC) 06/05/2021   Diarrhea of presumed infectious origin    Fibrocystic disease of breast 06/04/2021   Partial small bowel obstruction (HCC) 06/04/2021   History of C-section 04/17/2021   Migraine without status migrainosus, not  intractable 09/12/2017   Ulcerative colitis with complication (HCC) 01/28/2017   Osteoporosis without current pathological fracture 01/28/2017    REFERRING PROVIDER:  Emiliano Leonce CROME, PA-C   REFERRING DIAG:  [phone] (ICD-10-CM) - It band syndrome, left    Rationale for Evaluation and Treatment: Rehabilitation  THERAPY DIAG:  Pain in left hip  Muscle weakness (generalized)  ONSET DATE: chronic   SUBJECTIVE:  SUBJECTIVE STATEMENT: A little sore, a lot of popping after the last session   PERTINENT HISTORY:  UC, OP, h/o C-section  PAIN:  Are you having pain? Yes: NPRS scale: mild-moderate  Pain location: Lt lateral hip Pain description: snap Aggravating factors: stair climber, squats Relieving factors: cortisone injection  PRECAUTIONS:  None  RED FLAGS: None   WEIGHT BEARING RESTRICTIONS:  No  FALLS:  Has patient fallen in last 6 months? No  PLOF:  Independent  PATIENT GOALS:  Do squats without pain.    OBJECTIVE:  Note: Objective measures were completed at Evaluation unless otherwise noted.  PATIENT SURVEYS:  N/a  COGNITIVE STATUS: Within functional limits for tasks assessed   POSTURE:  Eval: Lt PSIS elevation, Rt post innom rotation with functional LLD (Lt longer)  Lumbar dextroscoliosis    Eval: SLS trendelenburg on Right.                                                                                                                             TREATMENT DATE:   2/6 Trigger Point Dry Needling  Subsequent Treatment: Instructions provided previously at initial dry needling treatment.   Patient Verbal Consent Given: Yes Education Handout Provided: Previously Provided Muscles Treated: Lt hip- glut med/min, left proximal IT band well below greater  trochanter Electrical Stimulation Performed: No Treatment Response/Outcome: twitch with decreased concordant pain   Manual: Skilled palpation of trigger points.  Trigger point release to muscles needled.  Review of self soft tissue mobilization using roller.  Reach drill to counter with mild knee bend 2 x 10.  Reviewed how to progress at home. Start drill with cueing on how to increase at home x 5 each foot position Hip abduction with band above knees 2 x 10 red   Treatment                            1/24:  Trigger Point Dry Needling  Subsequent Treatment: Instructions provided previously at initial dry needling treatment.   Patient Verbal Consent Given: Yes Education Handout Provided: Previously Provided Muscles Treated: Lt hip- glut med/min, piriformis Electrical Stimulation Performed: No Treatment Response/Outcome: twitch with decreased concordant pain  STM around hip Ktape support TFL & glut med Clam+reverse & lower Wide bridge Sit<>stand ball bw knees- + hip hinge SLS with mini squat stairs   Treatment                            08/19/23:  Trigger Point Dry Needling  Subsequent Treatment: Instructions provided previously at initial dry needling treatment.   Patient Verbal Consent Given: Yes Education Handout Provided: Previously Provided Muscles Treated: Lt glut med/min, TFL Electrical Stimulation Performed: No Treatment Response/Outcome: twitch with decreased tension  Pilates reformer:  Supine foot work 2R1B Feet in straps in supine Sidelying foot in strap- unable to tolerate with Left foot in strap  Supine hands in straps Step up with glut activation- snapping at end of hip ext in stepping up.    Treatment                            08/12/23:  Reformer 2 red foot work on bar 1R1B bridging 1R1B sidelying press Short box 1R 1B Tall kneel hands in straps- facing straps, 1R1B- straight arm pull down, wide row, triceps    PATIENT EDUCATION:  Education  details: Anatomy of condition, POC, HEP, exercise form/rationale Person educated: Patient Education method: Explanation, Demonstration, Tactile cues, Verbal cues, and Handouts Education comprehension: verbalized understanding, returned demonstration, verbal cues required, tactile cues required, and needs further education  HOME EXERCISE PROGRAM: [id number] Requested upright bike for cardio (in place of stair stepper)   ASSESSMENT:  CLINICAL IMPRESSION: Therapy needled further down the patient's IT band today.  She had a great twitch response.  We also reviewed exercises that she can do at home that are similar to Pilates exercises.  She has been taking Pilates classes.  She feels pretty comfortable with her home exercise program at this time.  We will reduce to 1 time a week at this time.  She may also cancel next week if she is feeling good.  She did report some clicking towards the end of the session.   REHAB POTENTIAL: Good  CLINICAL DECISION MAKING: Evolving/moderate complexity  EVALUATION COMPLEXITY: Moderate   GOALS: Goals reviewed with patient? Yes  SHORT TERM GOALS: Target date: 2/1  Presents with level pelvis for resolution of functional LLD  Goal status: INITIAL  2.  Cardio in gym without snapping  Goal status: INITIAL   LONG TERM GOALS: Target date: POC date  Independent in HEP for lumbopelvic strength and stability   Goal status: INITIAL  2.  Able to perform squatting motion for functional needs without hip pain  Goal status: INITIAL  3.  Good control with dynamic balance activities to reduce risk of fall resulting in fx  Goal status: INITIAL     PLAN:  PT FREQUENCY: 1-2x/week  PT DURATION: 8 weeks  PLANNED INTERVENTIONS: 97164- PT Re-evaluation, 97110-Therapeutic exercises, 97530- Therapeutic activity, 97112- Neuromuscular re-education, 97535- Self Care, [id number]- Manual therapy, [phone]- Gait training, [phone]- Aquatic Therapy, [phone]- Ionotophoresis  4mg /ml Dexamethasone , Patient/Family education, Balance training, Stair training, Taping, Dry Needling, Joint mobilization, Spinal mobilization, Scar mobilization, Cryotherapy, and Moist heat.  PLAN FOR NEXT SESSION: recheck rotation, would like to work on pilates reformers  Alm Don PT DPT 09/04/23 12:25 PM

## 2023-09-05 ENCOUNTER — Encounter (HOSPITAL_BASED_OUTPATIENT_CLINIC_OR_DEPARTMENT_OTHER): Payer: Commercial Managed Care - PPO | Admitting: Physical Therapy

## 2023-09-09 ENCOUNTER — Encounter (HOSPITAL_BASED_OUTPATIENT_CLINIC_OR_DEPARTMENT_OTHER): Payer: Commercial Managed Care - PPO | Admitting: Physical Therapy

## 2023-09-11 ENCOUNTER — Encounter (HOSPITAL_BASED_OUTPATIENT_CLINIC_OR_DEPARTMENT_OTHER): Payer: Commercial Managed Care - PPO | Admitting: Physical Therapy

## 2023-09-16 ENCOUNTER — Encounter (HOSPITAL_BASED_OUTPATIENT_CLINIC_OR_DEPARTMENT_OTHER): Payer: Commercial Managed Care - PPO | Admitting: Physical Therapy

## 2023-09-18 ENCOUNTER — Encounter (HOSPITAL_BASED_OUTPATIENT_CLINIC_OR_DEPARTMENT_OTHER): Payer: Commercial Managed Care - PPO | Admitting: Physical Therapy

## 2023-09-23 ENCOUNTER — Encounter (HOSPITAL_BASED_OUTPATIENT_CLINIC_OR_DEPARTMENT_OTHER): Payer: Commercial Managed Care - PPO | Admitting: Physical Therapy

## 2023-09-25 ENCOUNTER — Encounter (HOSPITAL_BASED_OUTPATIENT_CLINIC_OR_DEPARTMENT_OTHER): Payer: Commercial Managed Care - PPO | Admitting: Physical Therapy

## 2023-09-30 ENCOUNTER — Encounter (HOSPITAL_BASED_OUTPATIENT_CLINIC_OR_DEPARTMENT_OTHER): Payer: Commercial Managed Care - PPO | Admitting: Physical Therapy

## 2023-10-02 ENCOUNTER — Encounter (HOSPITAL_BASED_OUTPATIENT_CLINIC_OR_DEPARTMENT_OTHER): Payer: Commercial Managed Care - PPO | Admitting: Physical Therapy

## 2023-10-06 ENCOUNTER — Other Ambulatory Visit: Payer: Commercial Managed Care - PPO | Admitting: Gastroenterology

## 2023-10-24 ENCOUNTER — Ambulatory Visit (INDEPENDENT_AMBULATORY_CARE_PROVIDER_SITE_OTHER): Payer: Self-pay | Admitting: Plastic Surgery

## 2023-10-24 VITALS — BP 101/56 | HR 74

## 2023-10-24 DIAGNOSIS — Z719 Counseling, unspecified: Secondary | ICD-10-CM

## 2023-10-24 NOTE — Progress Notes (Signed)
 Botulinum Toxin and Filler Injection Procedure Note  Procedure: Cosmetic botulinum toxin and Filler administration  Pre-operative Diagnosis: Dynamic rhytides and midface volume loss  Post-operative Diagnosis: Same  Complications:  None  Brief history: The patient desires botulinum toxin injection of her forehead. I discussed with the patient this proposed procedure of botulinum toxin injections, which is customized depending on the particular needs of the patient. It is performed on facial rhytids as a temporary correction. The alternatives were discussed with the patient. The risks were addressed including bleeding, scarring, infection, damage to deeper structures, asymmetry, and chronic pain, which may occur infrequently after a procedure. The individual's choice to undergo a surgical procedure is based on the comparison of risks to potential benefits. Other risks include unsatisfactory results, brow ptosis, eyelid ptosis, allergic reaction, temporary paralysis, which should go away with time, bruising, blurring disturbances and delayed healing. Botulinum toxin injections do not arrest the aging process or produce permanent tightening of the eyelid.  Operative intervention maybe necessary to maintain the results of a blepharoplasty or botulinum toxin. The patient understands and wishes to proceed.  Procedure: The area was prepped with alcohol and dried with a clean gauze. Using a clean technique, the botulinum toxin was diluted with 1.25 cc of preservative-free normal saline which was slowly injected with an 18 gauge needle in a tuberculin syringes.  A 32 gauge needles were then used to inject the botulinum toxin. This mixture allow for an aliquot of 4 units per 0.1 cc in each injection site.    Subsequently the mixture was injected in the glabellar and forehead area with preservation of the temporal branch to the lateral eyebrow as well as into each lateral canthal area beginning from the lateral  orbital rim medial to the zygomaticus major in 3 separate areas. A total of 32 Units of botulinum toxin was used. The forehead and glabellar area was injected with care to inject intramuscular only while holding pressure on the supratrochlear vessels in each area during each injection on either side of the medial corrugators. The injection proceeded vertically superiorly to the medial 2/3 of the frontalis muscle and superior 2/3 of the lateral frontalis, again with preservation of the frontal branch.  The midface area was injected at the 3 sub-regions of the mid-face: zygomaticomalar region, anteromedial cheek region, and submalar region for a total of one syringe total. The technique used was serial puncture with equal injections in the 3 sub-regions: the zygomaticomalar region, the anteromedial cheek, and the submalar region.  No complications were noted. Light pressure was held for 5 minutes. She was instructed explicitly in post-operative care.  Botox LOT: [id number]  Restylane Contour LOT: [id number]

## 2024-01-13 ENCOUNTER — Telehealth: Payer: Self-pay

## 2024-01-13 NOTE — Telephone Encounter (Signed)
-----   Message from Annis Kinder sent at 01/13/2024  2:29 PM EDT ----- I received a message from this patient's husband (one of the orthopedic surgeons here) that Brooke Mckee has been having recurrent abdominal pain and distention.  History of colitis and prior surgery with pouch.  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  I told him she can come see me next week, 6/26 at 1:20.  He has not yet confirmed whether or not that works with the schedule.  Would you mind calling her to see if that works and overbook her with me.  Thanks.

## 2024-01-13 NOTE — Telephone Encounter (Signed)
 Phone call to the patient who agreed to appointment with Dr Karene Oto on 01-22-24 at 1:20pm.  Patient agreed to plan and verbalized understanding.  No further questions.

## 2024-01-22 ENCOUNTER — Ambulatory Visit: Admitting: Gastroenterology

## 2024-01-22 ENCOUNTER — Encounter: Payer: Self-pay | Admitting: Gastroenterology

## 2024-01-22 ENCOUNTER — Other Ambulatory Visit: Payer: Self-pay

## 2024-01-22 ENCOUNTER — Other Ambulatory Visit (HOSPITAL_BASED_OUTPATIENT_CLINIC_OR_DEPARTMENT_OTHER): Payer: Self-pay

## 2024-01-22 ENCOUNTER — Other Ambulatory Visit (INDEPENDENT_AMBULATORY_CARE_PROVIDER_SITE_OTHER)

## 2024-01-22 VITALS — BP 100/60 | HR 96 | Ht 67.0 in | Wt 128.0 lb

## 2024-01-22 DIAGNOSIS — R7989 Other specified abnormal findings of blood chemistry: Secondary | ICD-10-CM | POA: Diagnosis not present

## 2024-01-22 DIAGNOSIS — Z8719 Personal history of other diseases of the digestive system: Secondary | ICD-10-CM

## 2024-01-22 DIAGNOSIS — R194 Change in bowel habit: Secondary | ICD-10-CM | POA: Diagnosis not present

## 2024-01-22 DIAGNOSIS — R197 Diarrhea, unspecified: Secondary | ICD-10-CM

## 2024-01-22 DIAGNOSIS — D649 Anemia, unspecified: Secondary | ICD-10-CM | POA: Diagnosis not present

## 2024-01-22 DIAGNOSIS — K91850 Pouchitis: Secondary | ICD-10-CM | POA: Diagnosis not present

## 2024-01-22 DIAGNOSIS — K51919 Ulcerative colitis, unspecified with unspecified complications: Secondary | ICD-10-CM | POA: Diagnosis not present

## 2024-01-22 DIAGNOSIS — D509 Iron deficiency anemia, unspecified: Secondary | ICD-10-CM

## 2024-01-22 DIAGNOSIS — K580 Irritable bowel syndrome with diarrhea: Secondary | ICD-10-CM

## 2024-01-22 LAB — BASIC METABOLIC PANEL WITH GFR
BUN: 13 mg/dL (ref 6–23)
CO2: 27 meq/L (ref 19–32)
Calcium: 9.3 mg/dL (ref 8.4–10.5)
Chloride: 104 meq/L (ref 96–112)
Creatinine, Ser: 0.65 mg/dL (ref 0.40–1.20)
GFR: 114.62 mL/min (ref >60.00)
Glucose, Bld: 91 mg/dL (ref 70–99)
Potassium: 4.3 meq/L (ref 3.5–5.1)
Sodium: 139 meq/L (ref 135–145)

## 2024-01-22 LAB — B12 AND FOLATE PANEL
Folate: 13.2 ng/mL (ref >5.9)
Vitamin B-12: >1500 pg/mL — ABNORMAL HIGH (ref 211–911)

## 2024-01-22 LAB — IBC + FERRITIN
Ferritin: 2.2 ng/mL — ABNORMAL LOW (ref 10.0–291.0)
Iron: 94 ug/dL (ref 42–145)
Saturation Ratios: 21.9 % (ref 20.0–50.0)
TIBC: 429.8 ug/dL (ref 250.0–450.0)
Transferrin: 307 mg/dL (ref 212.0–360.0)

## 2024-01-22 LAB — CBC
HCT: 33.6 % — ABNORMAL LOW (ref 36.0–46.0)
Hemoglobin: 10.3 g/dL — ABNORMAL LOW (ref 12.0–15.0)
MCHC: 30.6 g/dL (ref 30.0–36.0)
MCV: 66.3 fl — ABNORMAL LOW (ref 78.0–100.0)
Platelets: 605 10*3/uL — ABNORMAL HIGH (ref 150.0–400.0)
RBC: 5.07 Mil/uL (ref 3.87–5.11)
RDW: 21.2 % — ABNORMAL HIGH (ref 11.5–15.5)
WBC: 6.5 10*3/uL (ref 4.0–10.5)

## 2024-01-22 LAB — C-REACTIVE PROTEIN: CRP: <1 mg/dL (ref 0.5–20.0)

## 2024-01-22 LAB — FOLATE: Folate: 13.2 ng/mL (ref >5.9)

## 2024-01-22 LAB — SEDIMENTATION RATE: Sed Rate: 33 mm/h — ABNORMAL HIGH (ref 0–20)

## 2024-01-22 LAB — VITAMIN D 25 HYDROXY (VIT D DEFICIENCY, FRACTURES): VITD: 21.96 ng/mL — ABNORMAL LOW (ref 30.00–100.00)

## 2024-01-22 MED ORDER — RIFAXIMIN 550 MG PO TABS
550.0000 mg | ORAL_TABLET | Freq: Two times a day (BID) | ORAL | 0 refills | Status: AC
  Filled 2024-01-22 – 2024-02-23 (×2): qty 28, 14d supply, fill #0

## 2024-01-22 NOTE — Progress Notes (Addendum)
 Chief Complaint:    Ulcerative Colitis, change in bowel habits  GI History: 33 year old female with a history of Ulcerative Colitis s/p laparoscopic total colectomy with ileostomy in 2012 and subsequent reversal with J-pouch.  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  History of iron deficiency anemia, previously treated with IV iron and PRBC transfusion 2012.  Not on any maintenance therapy.  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   - 11/2015: Flexible sigmoidoscopy: Pouchitis.  Treated with Cipro /Flagyl  - 05/2021: Hospital admission with pouchitis while [redacted] weeks pregnant.  Negative GI PCR panel.  CRP 9.9, calprotectin 603 - 06/04/2021: CT A/P: S/p colectomy and ileoanal anastomosis.  Moderate dilation of SB proximal to anastomosis s/o pSBO.  No inflammation of J-pouch or SB -06/05/2021: Flexible sigmoidoscopy: Mild, distal pouchitis (path: Chronic active enteritis without granulomata).  Healthy-appearing anastomosis.  Normal-appearing small bowel (path: Chronic active enteritis).  Treated with Flagyl  monotherapy due to pregnancy - 10/2021: Fecal calprotectin 45.  GI PCR panel positive for astrovirus, otherwise negative including C. difficile     Family history notable for father with UC, paternal uncle with UC, diagnosed colon cancer.  Sister with Crohn Disease.  HPI:     Patient is a 33 y.o. female presenting to the Gastroenterology Clinic for follow-up.  Was last seen in the GI clinic by Elida Nyle Sharps on 08/05/2023.  At that time was actively symptomatic with nausea/vomiting/diarrhea, suspected 2/2 acute viral gastroenteritis as she had multiple sick contacts (sons x 2, husband).  Improved with IV fluids and conservative measures.  WBC 12.4, anemia with H/H 9.2/30.2, PLT 1063.  Normal CRP, negative/normal GI PCR panel.  No new labs since then.  Recently has been having what she feels are  minor flares.  Not currently taking any antibiotics, probiotics, or other GI supplements.  Episodes have been occurring every 1-2 months described as a feeling of tenesmus and straining.  Has had hemorrhoids and anal fissures from straining, which resolved.  No draining fistulae.  She will typically do a liquid diet for 1 week and symptoms resolve back to her baseline of 3 BM/day.  Has had geographic tongue during episodes.  No abdominal pain.  Review of systems:     No chest pain, no SOB, no fevers, no urinary sx   Past Medical History:  Diagnosis Date   Migraines    Pouchitis (HCC)    Ulcerative colitis (HCC)     Patient's surgical history, family medical history, social history, medications and allergies were all reviewed in Epic    Current Outpatient Medications  Medication Sig Dispense Refill   levonorgestrel  (MIRENA ) 20 MCG/DAY IUD 1 each by Intrauterine route once.     scopolamine  (TRANSDERM-SCOP) 1 MG/3DAYS Place 1 patch (1.5 mg total) onto the skin every 3 (three) days. 1 patch 0   vancomycin (VANCOCIN) 125 MG capsule Take 125 mg by mouth 4 (four) times daily. (Patient not taking: Reported on 08/05/2023)     No current facility-administered medications for this visit.    Physical Exam:     BP 100/60   Pulse 96   Ht 5' 7 (1.702 m)   Wt 128 lb (58.1 kg)   BMI 20.05 kg/m   GENERAL:  Pleasant female in NAD PSYCH: : Cooperative, normal affect Musculoskeletal:  Normal muscle tone, normal strength NEURO: Alert and oriented x 3, no focal neurologic deficits   IMPRESSION and PLAN:    1) Ulcerative Colitis  2) History of recurrent pouchitis 3) Change in bowel habits 33 year old female with history of Ulcerative Colitis s/p laparoscopic total colectomy with ileostomy in 2012 and subsequent reversal with J-pouch.  Since then, has had history of recurrent pouchitis, typically 1-2 times per year.  Symptoms typically resolve with antibiotics (ciprofloxacin /Flagyl ).  More  recently has been having flares of LGI symptoms, which typically resolve with liquid diet for a week.  We discussed DDx at length today to include recurrent pouchitis or possibly reclassification to Crohn's disease.  We also discussed the possibility of overlapping IBS.  With regards to long-term management of recurrent pouchitis, thankfully this has been antibiotic responsive.  Discussed that when less than 3 episodes per year,  can typically be managed with single agent antimicrobial therapy (i.e. ciprofloxacin , metronidazole , or Tindazole) x 2 weeks without need for maintenance.  However, her more recent symptoms do increase suspicion a bit and discussed plan for the following:  - Flexible pouchoscopy to evaluate for disease activity with ileal evaluation and biopsies - Plan for EGD to potentially evaluate for UGI pathology such as Crohn's reclassification - Trial course of rifaximin  550 mg p.o. TID x14 days, RF 1 for treatment of overlapping IBS-D - Start probiotic and continue at least 4 weeks beyond completion of rifaximin  course - Discussed potentially starting mesalamine suppository depending on endoscopic findings - We additionally discussed immunosuppressive/biologic therapy, again depending on endoscopic findings.  She has been previously treated with rifaximin  years ago and if considering biologic therapy would not want an infusion again, but open to injectable such as Humira or oral agents such as Rinvoq - Routine micronutrient evaluation with B12, folate, along with labs as below  4) Microcytic anemia Last set of labs from 07/2023 notable for microcytic anemia with H/H 9.2/30.2, MCV/RDW 59.6/19.2.  This was in the setting of acute viral gastroenteritis, along with concomitant thrombocytosis (PLT 1063).  Does have a history of iron deficiency anemia in the past treated with IV iron and RBC transfusion in 2012. - Repeat CBC today - Check iron panel - Routine micronutrient evaluation -  EGD with random and directed biopsies - Check celiac panel  The indications, risks, and benefits of EGD and pouchoscopy were explained to the patient in detail. Risks include but are not limited to bleeding, perforation, adverse reaction to medications, and cardiopulmonary compromise. Sequelae include but are not limited to the possibility of surgery, hospitalization, and mortality. The patient verbalized understanding and wished to proceed. All questions answered, referred to scheduler and bowel prep ordered. Further recommendations pending results of the exam.          Sandor LULLA Flatter ,DO, FACG 01/22/2024, 1:27 PM

## 2024-01-22 NOTE — Patient Instructions (Addendum)
 _______________________________________________________  If your blood pressure at your visit was 140/90 or greater, please contact your primary care physician to follow up on this. _______________________________________________________  If you are age 35 or older, your body mass index should be between 23-30. Your Body mass index is 20.05 kg/m. If this is out of the aforementioned range listed, please consider follow up with your Primary Care Provider. ________________________________________________________  The [facility] providers would like to encourage you to use MYCHART to communicate with providers for non-urgent requests or questions.  Due to long hold times on the telephone, sending your provider a message by [facility] may be a faster and more efficient way to get a response.  Please allow 48 business hours for a response.  Please remember that this is for non-urgent requests.  _______________________________________________________  We have sent the following medications to your pharmacy for you to pick up at your convenience:  START: Xifaxan 550mg  one tablet twice daily for 14 days.  Use Probiotic 4 weeks after Xifaxan  Your provider has requested that you go to the basement level for lab work before leaving today. Press B on the elevator. The lab is located at the first door on the left as you exit the elevator.  You have been scheduled for an endoscopy and flexible sigmoidoscopy. Please follow the written instructions given to you at your visit today. If you use inhalers (even only as needed), please bring them with you on the day of your procedure.  Due to recent changes in healthcare laws, you may see the results of your imaging and laboratory studies on MyChart before your provider has had a chance to review them.  We understand that in some cases there may be results that are confusing or concerning to you. Not all laboratory results come back in the same time frame and the  provider may be waiting for multiple results in order to interpret others.  Please give us  48 hours in order for your provider to thoroughly review all the results before contacting the office for clarification of your results.   It was a pleasure to see you today!  Vito Cirigliano, D.O.

## 2024-01-23 ENCOUNTER — Other Ambulatory Visit

## 2024-01-23 DIAGNOSIS — R194 Change in bowel habit: Secondary | ICD-10-CM | POA: Diagnosis not present

## 2024-01-23 DIAGNOSIS — K91850 Pouchitis: Secondary | ICD-10-CM

## 2024-01-23 DIAGNOSIS — K51919 Ulcerative colitis, unspecified with unspecified complications: Secondary | ICD-10-CM

## 2024-01-25 LAB — TISSUE TRANSGLUTAMINASE, IGA: (tTG) Ab, IgA: <1 U/mL

## 2024-01-25 LAB — VITAMIN B12: Vitamin B-12: 1487 pg/mL — ABNORMAL HIGH (ref 200–1100)

## 2024-01-25 LAB — HEPATITIS B SURFACE ANTIGEN: Hepatitis B Surface Ag: NONREACTIVE

## 2024-01-25 LAB — QUANTIFERON-TB GOLD PLUS
Mitogen-NIL: 6.5 [IU]/mL
NIL: 0.03 [IU]/mL
QuantiFERON-TB Gold Plus: NEGATIVE
TB1-NIL: 0 [IU]/mL
TB2-NIL: 0 [IU]/mL

## 2024-01-25 LAB — IGA: Immunoglobulin A: 236 mg/dL (ref 47–310)

## 2024-01-25 LAB — HEPATITIS B SURFACE ANTIBODY,QUALITATIVE: Hep B S Ab: REACTIVE — AB

## 2024-01-26 ENCOUNTER — Ambulatory Visit: Payer: Self-pay | Admitting: Nurse Practitioner

## 2024-01-27 LAB — SPECIMEN STATUS REPORT

## 2024-01-29 ENCOUNTER — Ambulatory Visit: Payer: Self-pay | Admitting: Gastroenterology

## 2024-01-29 ENCOUNTER — Other Ambulatory Visit (HOSPITAL_BASED_OUTPATIENT_CLINIC_OR_DEPARTMENT_OTHER): Payer: Self-pay

## 2024-01-29 ENCOUNTER — Telehealth: Payer: Self-pay

## 2024-01-29 ENCOUNTER — Other Ambulatory Visit: Payer: Self-pay

## 2024-01-29 DIAGNOSIS — R194 Change in bowel habit: Secondary | ICD-10-CM

## 2024-01-29 DIAGNOSIS — R790 Abnormal level of blood mineral: Secondary | ICD-10-CM

## 2024-01-29 DIAGNOSIS — E559 Vitamin D deficiency, unspecified: Secondary | ICD-10-CM

## 2024-01-29 DIAGNOSIS — K51919 Ulcerative colitis, unspecified with unspecified complications: Secondary | ICD-10-CM

## 2024-01-29 DIAGNOSIS — K91850 Pouchitis: Secondary | ICD-10-CM

## 2024-01-29 LAB — CELIAC PANEL 10
Antigliadin Abs, IgA: 5 U (ref 0–19)
Endomysial IgA: NEGATIVE
Gliadin IgG: 2 U (ref 0–19)
IgA/Immunoglobulin A, Serum: 237 mg/dL (ref 87–352)
Tissue Transglut Ab: 4 U/mL (ref 0–5)
Transglutaminase IgA: <2 U/mL (ref 0–3)

## 2024-01-29 MED ORDER — VITAMIN D (ERGOCALCIFEROL) 1.25 MG (50000 UNIT) PO CAPS
50000.0000 [IU] | ORAL_CAPSULE | ORAL | 0 refills | Status: AC
  Filled 2024-01-29: qty 8, 56d supply, fill #0

## 2024-01-29 MED ORDER — FERROUS SULFATE 325 (65 FE) MG PO TBEC: 325.0000 mg | DELAYED_RELEASE_TABLET | ORAL | Status: DC

## 2024-01-29 NOTE — Telephone Encounter (Signed)
 Phone call to the patient regarding lab results and patient informed me that she was not able to pick up Rx for Xifaxan  550mg  bid for 14 days dx change in bowel habits (R19.4) and recurrent pouchitis (K91.850).

## 2024-01-30 LAB — CALPROTECTIN: Calprotectin: 26 ug/g

## 2024-02-02 ENCOUNTER — Telehealth: Payer: Self-pay

## 2024-02-02 ENCOUNTER — Other Ambulatory Visit (HOSPITAL_COMMUNITY): Payer: Self-pay

## 2024-02-02 ENCOUNTER — Other Ambulatory Visit (HOSPITAL_BASED_OUTPATIENT_CLINIC_OR_DEPARTMENT_OTHER): Payer: Self-pay

## 2024-02-02 NOTE — Telephone Encounter (Signed)
 PA request has been Submitted. New Encounter has been or will be created for follow up. For additional info see Pharmacy Prior Auth telephone encounter from 02-02-2024.

## 2024-02-02 NOTE — Telephone Encounter (Signed)
 Pharmacy Patient Advocate Encounter   Received notification from Pt Calls Messages that prior authorization for Xifaxan  550MG  tablets is required/requested.   Insurance verification completed.   The patient is insured through [facility] .   Per test claim: PA required; PA submitted to above mentioned insurance via CoverMyMeds Key/confirmation #/EOC [id number] Status is pending

## 2024-02-04 NOTE — Telephone Encounter (Signed)
 Pharmacy Patient Advocate Encounter  Received notification from MED[employer] that Prior Authorization for Xifaxan  550MG  tablets has been DENIED.  Full denial letter will be uploaded to the media tab. See denial reason below.  Based on the information sent in for review, the requested drug did not meet our guideline rules. To be approved, your doctor needs to show that you have met the guideline rules below.  Our guideline named RIFAXIMIN  (reviewed for Xifaxan  550mg  tablets) requires that the request is for ONE of the following:  A. Reduction in risk of overt hepatic encephalopathy (HE: a type of brain condition caused by liver damage) recurrence (return) B. Irritable bowel syndrome with diarrhea (IBS-D: a type of bowel disease) Your doctor requested this medication for changes in your bowel habits and pouchitis (inflammation in your ileal pouch), but it is not clinically supported for this use. Because of this, additional requirement must be met for approval.  When used for the treatment of an off-label use, our guideline named OFF-LABEL (reviewed for Xifaxan ) requires that the following conditions be met:  1) Your provider has provided documentation that supports that the requested off-label use is considered safe and effective by approved compendia (medical references such as Clinical Pharmacology, [place] [facility], and [employer] Lexi-Drugs), guidelines issued by leading nationally-recognized associations and agencies (such as the [employer] of Gastroenterology or the [employer]) or  found in at least two (2) high-quality articles from major peer-reviewed medical journals (such as Lancet or The [place] journal of Medicine) 2) You meet ONE (1) of the following:  A) You have tried and failed at least two (2) clinically appropriate formulary (list of covered drugs) alternatives from the same drug class as Xifaxan  (if available) for the  management of your condition B) Your provider has provided documentation of a valid medical reason why you cannot use ALL covered alternatives (a contraindication) because they may impact your other health conditions or interact with other drugs you are taking   PA #/Case ID/Reference #: [id number]

## 2024-02-09 ENCOUNTER — Other Ambulatory Visit: Payer: Self-pay

## 2024-02-09 NOTE — Telephone Encounter (Signed)
 Information has been sent to clinical pharmacist for appeals review. It may take 5-7 days to prepare the necessary documentation to request the appeal from the insurance.

## 2024-02-10 ENCOUNTER — Telehealth: Payer: Self-pay | Admitting: Pharmacist

## 2024-02-10 NOTE — Telephone Encounter (Signed)
 Per insurance, an appeal can be submitted for Xifaxan  for the treatment of IBS-D; however, the patient must also meet the following criteria for consideration under this diagnosis:    I was unable to locate documentation in the chart that the patient has tried Viberzi.  I did see past use of amitriptyline. I Please advise on how you would like us  to proceed.  Thank you, Devere Pandy, PharmD Clinical Pharmacist  [place]  Direct Dial: [phone]

## 2024-02-10 NOTE — Telephone Encounter (Signed)
 Dr San pt will send to Pod B

## 2024-02-10 NOTE — Telephone Encounter (Signed)
 An E-Appeal has been submitted for Xifaxan . Will advise when response is received. Appeal letter and supporting documentation were uploaded and submitted via the [facility] website on 02/10/2024 @4 :37 pm.  Thank you, Devere Pandy, PharmD Clinical Pharmacist  [place]  Direct Dial: [phone]

## 2024-02-10 NOTE — Telephone Encounter (Signed)
 Appeal submitted and is documented in a separate telephone encounter.

## 2024-02-13 NOTE — Telephone Encounter (Signed)
 Pharmacy Patient Advocate Encounter  Received notification from [employer] that Prior Authorization for Xifaxan  550MG  tablets has been APPROVED from 02-11-2024 to 04-07-2024 for a maximum fill of 1 (one)   PA #/Case ID/Reference #: [id number]

## 2024-02-13 NOTE — Telephone Encounter (Signed)
 Pt was sent a my chart message making her aware of the approval and also  left a detailed voice message on her phone.

## 2024-02-17 NOTE — Telephone Encounter (Signed)
 Could you please check the status of the appeal for Xifaxan ?

## 2024-02-17 NOTE — Telephone Encounter (Signed)
 PA was approved on 7-18 and Elspeth left both a Print production planner message for patient to let her know.

## 2024-02-23 ENCOUNTER — Encounter: Payer: Self-pay | Admitting: Gastroenterology

## 2024-02-23 ENCOUNTER — Other Ambulatory Visit (HOSPITAL_BASED_OUTPATIENT_CLINIC_OR_DEPARTMENT_OTHER): Payer: Self-pay

## 2024-02-27 ENCOUNTER — Ambulatory Visit (INDEPENDENT_AMBULATORY_CARE_PROVIDER_SITE_OTHER): Payer: Self-pay | Admitting: Plastic Surgery

## 2024-02-27 DIAGNOSIS — Z719 Counseling, unspecified: Secondary | ICD-10-CM

## 2024-02-27 NOTE — Progress Notes (Signed)

## 2024-03-02 ENCOUNTER — Ambulatory Visit: Admitting: Gastroenterology

## 2024-03-02 ENCOUNTER — Encounter: Payer: Self-pay | Admitting: Gastroenterology

## 2024-03-02 VITALS — BP 101/60 | HR 62 | Temp 97.9°F | Resp 13 | Ht 67.0 in | Wt 128.0 lb

## 2024-03-02 DIAGNOSIS — R194 Change in bowel habit: Secondary | ICD-10-CM | POA: Diagnosis not present

## 2024-03-02 DIAGNOSIS — K91850 Pouchitis: Secondary | ICD-10-CM

## 2024-03-02 DIAGNOSIS — K3189 Other diseases of stomach and duodenum: Secondary | ICD-10-CM | POA: Diagnosis not present

## 2024-03-02 DIAGNOSIS — E559 Vitamin D deficiency, unspecified: Secondary | ICD-10-CM

## 2024-03-02 DIAGNOSIS — K51919 Ulcerative colitis, unspecified with unspecified complications: Secondary | ICD-10-CM

## 2024-03-02 DIAGNOSIS — K9189 Other postprocedural complications and disorders of digestive system: Secondary | ICD-10-CM | POA: Diagnosis not present

## 2024-03-02 DIAGNOSIS — K529 Noninfective gastroenteritis and colitis, unspecified: Secondary | ICD-10-CM | POA: Diagnosis not present

## 2024-03-02 DIAGNOSIS — K633 Ulcer of intestine: Secondary | ICD-10-CM | POA: Diagnosis not present

## 2024-03-02 DIAGNOSIS — K6389 Other specified diseases of intestine: Secondary | ICD-10-CM | POA: Diagnosis not present

## 2024-03-02 DIAGNOSIS — D509 Iron deficiency anemia, unspecified: Secondary | ICD-10-CM | POA: Diagnosis not present

## 2024-03-02 DIAGNOSIS — R198 Other specified symptoms and signs involving the digestive system and abdomen: Secondary | ICD-10-CM | POA: Diagnosis not present

## 2024-03-02 MED ORDER — SODIUM CHLORIDE 0.9 % IV SOLN: 500.0000 mL | INTRAVENOUS | Status: DC

## 2024-03-02 NOTE — Progress Notes (Signed)
 Called to room to assist during endoscopic procedure.  Patient ID and intended procedure confirmed with present staff. Received instructions for my participation in the procedure from the performing physician.

## 2024-03-02 NOTE — Progress Notes (Signed)
 Report to PACU, RN, vss, BBS= Clear.

## 2024-03-02 NOTE — Progress Notes (Signed)
 GASTROENTEROLOGY PROCEDURE H&P NOTE   Primary Care Physician: Pcp, No    Reason for Procedure:  Ulcerative Colitis, change in bowel habits, tenesmus, recurrent pouchitis, vitamin D  deficiency, iron deficiency anemia  Plan:    EGD, pouchoscopy  Patient is appropriate for endoscopic procedure(s) in the ambulatory ([facility]) setting.  The nature of the procedure, as well as the risks, benefits, and alternatives were carefully and thoroughly reviewed with the patient. Ample time for discussion and questions allowed. The patient understood, was satisfied, and agreed to proceed.     HPI: Brooke Mckee is a 33 y.o. female who presents for EGD and pouchoscopy.   History of Ulcerative Colitis s/p laparoscopic total colectomy with ileostomy in 2012 and subsequent reversal with J-pouch.  Since then, has had history of recurrent pouchitis, typically 1-2 times per year.  Symptoms typically resolve with antibiotics (ciprofloxacin /Flagyl ).  More recently has been having flares of LGI symptoms, which typically resolve with liquid diet for a week.  Fecal calprotectin normal.  Presents today for pouchoscopy along with EGD to rule out Crohn's reclassification along with evaluation of vitamin D  deficiency and iron deficiency anemia with ferritin 2.2.  Trialed course of rifaximin .  Past Medical History:  Diagnosis Date   Allergy    Migraines    Pouchitis (HCC)    Ulcerative colitis [facility])     Past Surgical History:  Procedure Laterality Date   BIOPSY  06/05/2021   Procedure: BIOPSY;  Surgeon: San Sandor GAILS, DO;  Location: [facility];  Service: Gastroenterology;;   CESAREAN SECTION     CESAREAN SECTION N/A 07/11/2021   Procedure: CESAREAN SECTION;  Surgeon: Eveline Lynwood MATSU, MD;  Location: [facility] LD ORS;  Service: Obstetrics;  Laterality: N/A;   EXCISION MASS ABDOMINAL N/A 07/09/2023   Procedure: excision of abdominal adhesions;  Surgeon: Lowery Estefana RAMAN, DO;  Location: [facility];  Service: Plastics;  Laterality: N/A;   FLEXIBLE SIGMOIDOSCOPY N/A 06/05/2021   Procedure: FLEXIBLE SIGMOIDOSCOPY;  Surgeon: San Sandor GAILS, DO;  Location: [facility];  Service: Gastroenterology;  Laterality: N/A;  unsedated procedure   ILEAL POUCH  2012   [place]   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 3 months)    Prior to Admission medications   Medication Sig Start Date End Date Taking? Authorizing Provider  levonorgestrel  (MIRENA ) 20 MCG/DAY IUD 1 each by Intrauterine route once.   Yes [provider]  ferrous sulfate  325 (65 FE) MG EC tablet Take 1 tablet (325 mg total) by mouth every other day. 01/29/24   Rodolfo Gaster V, DO  rifaximin  (XIFAXAN ) 550 MG TABS tablet Take 1 tablet (550 mg total) by mouth 2 (two) times daily for 14 days. 01/22/24 03/08/24  Zavian Slowey V, DO  Vitamin D , Ergocalciferol , (DRISDOL ) 1.25 MG (50000 UNIT) CAPS capsule Take 1 capsule (50,000 Units total) by mouth every 7 (seven) days for 8 doses. Following completion of ergocalciferol , start vitamin D  daily supplement 2000 IU daily. 01/29/24 03/31/24  Kenslei Hearty V, DO    Current Outpatient Medications  Medication Sig Dispense Refill   levonorgestrel  (MIRENA ) 20 MCG/DAY IUD 1 each by Intrauterine route once.     ferrous sulfate  325 (65 FE) MG EC tablet Take 1 tablet (325 mg total) by mouth every other day.     rifaximin  (XIFAXAN ) 550 MG TABS tablet Take 1 tablet (550 mg total) by mouth 2 (two) times daily for 14 days. 28 tablet 0   Vitamin D , Ergocalciferol , (DRISDOL )  1.25 MG (50000 UNIT) CAPS capsule Take 1 capsule (50,000 Units total) by mouth every 7 (seven) days for 8 doses. Following completion of ergocalciferol , start vitamin D  daily supplement 2000 IU daily. 8 capsule 0   Current Facility-Administered Medications  Medication Dose Route Frequency Provider Last Rate Last Admin   0.9 %  sodium chloride  infusion  500 mL Intravenous Continuous Kaiyah Eber V, DO         Allergies as of 03/02/2024 - Review Complete 03/02/2024  Allergen Reaction Noted   Morphine Hives 07/10/2020    Family History  Problem Relation Age of Onset   Colitis Father    Colon cancer Paternal Uncle    Rectal cancer Neg Hx    Esophageal cancer Neg Hx    Stomach cancer Neg Hx     Social History   Socioeconomic History   Marital status: Married    Spouse name: Not on file   Number of children: Not on file   Years of education: Not on file   Highest education level: Not on file  Occupational History   Not on file  Tobacco Use   Smoking status: Never   Smokeless tobacco: Never  Vaping Use   Vaping status: Never Used  Substance and Sexual Activity   Alcohol use: Never   Drug use: Never   Sexual activity: Yes  Other Topics Concern   Not on file  Social History Narrative   Not on file   Social Drivers of Health   Financial Resource Strain: Not on file  Food Insecurity: No Food Insecurity (07/19/2021)   Hunger Vital Sign    Worried About Running Out of Food in the Last Year: Never true    Ran Out of Food in the Last Year: Never true  Transportation Needs: No Transportation Needs (07/19/2021)   PRAPARE - Administrator, Civil Service (Medical): No    Lack of Transportation (Non-Medical): No  Physical Activity: Not on file  Stress: Not on file  Social Connections: Not on file  Intimate Partner Violence: Not on file    Physical Exam: Vital signs in last 24 hours: @BP  136/62   Pulse 76   Temp 97.9 F (36.6 C)   Ht 5' 7 (1.702 m)   Wt 128 lb (58.1 kg)   SpO2 100%   Breastfeeding Yes   BMI 20.05 kg/m  GEN: NAD EYE: Sclerae anicteric ENT: MMM CV: Non-tachycardic Pulm: CTA b/l GI: Soft, NT/ND NEURO:  Alert & Oriented x 3   Sandor Flatter, DO [facility]   03/02/2024 2:44 PM

## 2024-03-02 NOTE — Progress Notes (Signed)
 Pt's states no medical or surgical changes since previsit or office visit.

## 2024-03-02 NOTE — Patient Instructions (Signed)

## 2024-03-02 NOTE — Op Note (Signed)
 [facility] Patient Name: Brooke Mckee Procedure Date: 03/02/2024 2:44 PM MRN: [id number] Endoscopist: Sandor Flatter , MD, [id number] Age: 33 Referring MD:  Date of Birth: 01-24-1991 Gender: Female Account #: [id number] Procedure:                Upper GI endoscopy Indications:              Iron deficiency anemia, Vitamin D  deficiency                           34 yo female with a history of Ulcerative Colitis                            s/p laparoscopic total colectomy with ileostomy in                            2012 and subsequent reversal with J-pouch. Since                            then, has had history of recurrent pouchitis,                            typically 1-2 times per year, but more recently                            with more frequent and progressive lower GI                            symptoms, including change in bowel habits,                            tenesmus. Additionally, recent labs with vitamin D                             deficiency and iron deficiency. ESR mildly elevated                            at 33, CRP normal. Fecal calprotectin normal. Medicines:                Monitored Anesthesia Care Procedure:                Pre-Anesthesia Assessment:                           - Prior to the procedure, a History and Physical                            was performed, and patient medications and                            allergies were reviewed. The patient's tolerance of                            previous anesthesia was also reviewed. The risks  and benefits of the procedure and the sedation                            options and risks were discussed with the patient.                            All questions were answered, and informed consent                            was obtained. Prior Anticoagulants: The patient has                            taken no anticoagulant or antiplatelet agents. ASA                             Grade Assessment: II - A patient with mild systemic                            disease. After reviewing the risks and benefits,                            the patient was deemed in satisfactory condition to                            undergo the procedure.                           After obtaining informed consent, the endoscope was                            passed under direct vision. Throughout the                            procedure, the patient's blood pressure, pulse, and                            oxygen saturations were monitored continuously. The                            Olympus Scope [id number] was introduced through the                            mouth, and advanced to the third part of duodenum.                            The upper GI endoscopy was accomplished without                            difficulty. The patient tolerated the procedure                            well. Scope In: Scope Out: Findings:  The examined esophagus was normal.                           The Z-line was regular and was found 39 cm from the                            incisors.                           The entire examined stomach was normal. Biopsies                            were taken with a cold forceps for Helicobacter                            pylori testing. Estimated blood loss was minimal.                           The examined duodenum was normal. Biopsies were                            taken with a cold forceps for histology. Estimated                            blood loss was minimal. Complications:            No immediate complications. Estimated Blood Loss:     Estimated blood loss was minimal. Impression:               - Normal esophagus.                           - Z-line regular, 39 cm from the incisors.                           - Normal stomach. Biopsied.                           - Normal examined duodenum. Biopsied. Recommendation:           - Patient has a  contact number available for                            emergencies. The signs and symptoms of potential                            delayed complications were discussed with the                            patient. Return to normal activities tomorrow.                            Written discharge instructions were provided to the                            patient.                           -  Resume previous diet.                           - Continue present medications.                           - Await pathology results.                           - Pouchoscopy today. Sandor Flatter, MD 03/02/2024 3:28:04 PM

## 2024-03-02 NOTE — Op Note (Signed)
 [facility] Patient Name: Brooke Mckee Procedure Date: 03/02/2024 2:43 PM MRN: [id number] Endoscopist: Sandor Flatter , MD, [id number] Age: 32 Referring MD:  Date of Birth: 15-Apr-1991 Gender: Female Account #: [id number] Procedure:                Colonoscopy Indications:              Change in bowel habits, tenesmus, iron deficiency                            anemia                           33 yo female with history of Ulcerative Colitis s/p                            laparoscopic total colectomy with ileostomy in 2012                            and subsequent reversal with J-pouch. Since then,                            has had history of recurrent pouchitis, typically                            1-2 times per year, but more recently with more                            frequent and progressive lower GI symptoms,                            including change in bowel habits, tenesmus.                            Additionally, recent labs with vitamin D  deficiency                            and iron deficiency. ESR mildly elevated at 33, CRP                            normal. Fecal calprotectin normal. Medicines:                Monitored Anesthesia Care Procedure:                Pre-Anesthesia Assessment:                           - Prior to the procedure, a History and Physical                            was performed, and patient medications and                            allergies were reviewed. The patient's tolerance of  previous anesthesia was also reviewed. The risks                            and benefits of the procedure and the sedation                            options and risks were discussed with the patient.                            All questions were answered, and informed consent                            was obtained. Prior Anticoagulants: The patient has                            taken no anticoagulant or antiplatelet agents.  ASA                            Grade Assessment: II - A patient with mild systemic                            disease. After reviewing the risks and benefits,                            the patient was deemed in satisfactory condition to                            undergo the procedure.                           After obtaining informed consent, the colonoscope                            was passed under direct vision. Throughout the                            procedure, the patient's blood pressure, pulse, and                            oxygen saturations were monitored continuously. The                            Olympus Scope [id number] was introduced through the                            anus and advanced to the the ileocolonic                            anastomosis. Anatomical landmarks were photographed. Scope In: 3:02:35 PM Scope Out: 3:20:23 PM Total Procedure Duration: 0 hours 17 minutes 48 seconds  Findings:                 The perianal and digital rectal examinations were  normal.                           There was evidence of a prior J pouch anastomosis                            in the distal rectum. This was patent and was                            characterized by localized erythema. The                            anastomosis was traversed. Biopsies were taken with                            a cold forceps for histology. Estimated blood loss                            was minimal.                           The colonoscope was advanced deep into the small                            bowel, approximately 75 cm from the anal verge.                            There was an area of moderate inflammation located                            40-55 cm from the anal verge, and another area                            located 25-30 cm from the anal verge. These areas                            of patchy inflammation were characterized by                             congestion (edema), erythema, and deep ulcerations.                            The inflammation was moderate in severity. Biopsies                            were taken with a cold forceps for histology.                            Estimated blood loss was minimal.                           There was active inflammation in the J-pouch as  well, again characterized by moderate edema,                            erythema, and deep ulceration. Biopsies were taken. Complications:            No immediate complications. Estimated Blood Loss:     Estimated blood loss was minimal. Impression:               - Patent anastomosis, characterized by erythema.                            Biopsied.                           - Active inflammation in a patchy distribution in                            the neoterminal ileum, located 40-55 cm and 25-30                            cm from the anal verge, respectively. The mucosa in                            between these areas was otherwise normal-appearing.                            Additionally, there is active inflammation in the                            J-pouch. This was graded as moderate in severity.                            This was extensively biopsied.                           Overall, the endoscopic appearance is more                            suspicious for Crohn's reclassification. Will await                            pathology results, but likely plan to initiate                            biologic therapy. Recent QuantiFERON gold negative                            and appropriate immunity to hepatitis B, so                            additional labs not needed at this time. Will                            discuss the role/utility of CT enterography pending  pathology results. Recommendation:           - Patient has a contact number available for                             emergencies. The signs and symptoms of potential                            delayed complications were discussed with the                            patient. Return to normal activities tomorrow.                            Written discharge instructions were provided to the                            patient.                           - Resume previous diet.                           - Continue present medications.                           - Await pathology results. Sandor Flatter, MD 03/02/2024 3:44:29 PM

## 2024-03-03 ENCOUNTER — Telehealth: Payer: Self-pay | Admitting: *Deleted

## 2024-03-03 NOTE — Telephone Encounter (Signed)
  Follow up Call-     03/02/2024    2:00 PM  Call back number  Post procedure Call Back phone  # [phone]  Permission to leave phone message Yes     Patient questions:  Do you have a fever, pain , or abdominal swelling? No. Pain Score  0 *  Have you tolerated food without any problems? Yes.    Have you been able to return to your normal activities? Yes.    Do you have any questions about your discharge instructions: Diet   No. Medications  No. Follow up visit  No.  Do you have questions or concerns about your Care? No.  Actions: * If pain score is 4 or above: No action needed, pain <4.

## 2024-03-04 LAB — SURGICAL PATHOLOGY

## 2024-03-05 ENCOUNTER — Ambulatory Visit: Payer: Self-pay | Admitting: Gastroenterology

## 2024-03-05 ENCOUNTER — Telehealth: Payer: Self-pay

## 2024-03-05 DIAGNOSIS — K5000 Crohn's disease of small intestine without complications: Secondary | ICD-10-CM | POA: Insufficient documentation

## 2024-03-05 NOTE — Telephone Encounter (Signed)
 Cirigliano, Sandor GAILS, DO to [place], Comer HERO, RN  Bonner Larue, Cristino SAILOR, RN (Selected Message) Result Note I spoke with Brooke Mckee by phone today regarding biopsy results.  Mild inflammatory changes in the duodenum without features of Celiac Disease.  Normal gastric biopsies.   Biopsies taken from the ileum and J-pouch all demonstrate chronic, active ileitis with moderate to severe activity, consistent with Crohn's Disease.   We discussed medication management.  She has had Remicade in the past and states that it never worked, suspicious that she has a primary nonresponder to Remicade.  Have concerns that she would be a nonresponder to other TNF as well.  Additionally, not sure that Humira would be as efficacious given severity of disease noted on ileoscopy.  Discussed instead treating with Skyrizi, which is indicated both for her Crohn's disease along with treatment of pouchitis.  She would like to proceed with [employer].   Brooke Mckee, I entered orders for [employer] to infusion center for induction with IV infusion 600 mg at week 0, 4, 8. Will need to f/u on the PA with this. Plan will be to then change to subcutaneous injections for maintenance therapy.  Plan to start that at 360 mg subcutaneous injection at week 12, then every 8 weeks afterwards.  Can potentially reduce to 180 mg depending on clinical response.   To f/u with me in 3 months or sooner as needed. No separate path recall letter needed at this time.

## 2024-03-05 NOTE — Telephone Encounter (Signed)
 Will place order for subcutaneous injections for maintenance therapy once Skyrizi infusion PA is obtained. Plan to start that at 360 mg subcutaneous injection at week 12, then every 8 weeks afterwards. Can potentially reduce to 180 mg depending on clinical response.   21-month office recall and reminder in epic.

## 2024-03-08 ENCOUNTER — Telehealth: Payer: Self-pay

## 2024-03-08 MED ORDER — SKYRIZI 360 MG/2.4ML ~~LOC~~ SOCT: SUBCUTANEOUS | 4 refills | Status: DC

## 2024-03-08 NOTE — Telephone Encounter (Addendum)
 Auth Submission: APPROVED Site of care: Site of care: [facility] Payer: [employer] commercial Medication & CPT/J Code(s) submitted: Skyrizi  (Risankizumab -rzaa) G7672 Diagnosis Code:  Route of submission (phone, fax, portal): Latent Phone # Fax # Auth type: Buy/Bill PB Units/visits requested: 600mg  x 3 doses Reference number: [id number] Approval from: 03/08/24 to 06/07/24

## 2024-03-08 NOTE — Telephone Encounter (Signed)
 Maintenance dose for Skyrizi  sent to [facility] for AUTH.

## 2024-03-09 ENCOUNTER — Other Ambulatory Visit (HOSPITAL_COMMUNITY): Payer: Self-pay

## 2024-03-09 ENCOUNTER — Telehealth: Payer: Self-pay

## 2024-03-09 NOTE — Telephone Encounter (Signed)
 Pharmacy Patient Advocate Encounter   Received notification from [employer] that prior authorization for Skyrizi  360MG /2.4ML (150MG /ML) single-dose prefilled cartridge with on-body injector is required/requested.   Insurance verification completed.   The patient is insured through [facility] .   Per test claim: PA required; PA submitted to above mentioned insurance via Latent Key/confirmation #/EOC [id number] Status is pending

## 2024-03-12 NOTE — Telephone Encounter (Signed)
 Please see note below.

## 2024-03-15 ENCOUNTER — Encounter: Payer: Self-pay | Admitting: Gastroenterology

## 2024-03-15 ENCOUNTER — Other Ambulatory Visit (HOSPITAL_COMMUNITY): Payer: Self-pay

## 2024-03-15 MED ORDER — SKYRIZI 360 MG/2.4ML ~~LOC~~ SOCT: SUBCUTANEOUS | 4 refills | Status: DC

## 2024-03-15 NOTE — Telephone Encounter (Signed)
 Pharmacy Patient Advocate Encounter  Received notification from [facility] that Prior Authorization for Skyrizi  360MG /2.4ML (150MG /ML) single-dose prefilled cartridge with on-body injector has been APPROVED from 05-27-2024 to 08-25-2024   PA #/Case ID/Reference #: [id number]   Additional authorization is approved for loading (initial) dose of Skyrizi  600mg / 10mL vial allowing 20mL per 28 days effective 03-11-2024 to 06-09-2024. Please reference authorization [id number]

## 2024-03-15 NOTE — Telephone Encounter (Signed)
 Skyrizi  maintenance prescription has been sent to [facility].

## 2024-03-15 NOTE — Telephone Encounter (Signed)
 Noted

## 2024-03-22 ENCOUNTER — Ambulatory Visit (INDEPENDENT_AMBULATORY_CARE_PROVIDER_SITE_OTHER)

## 2024-03-22 VITALS — BP 103/63 | HR 77 | Temp 97.8°F | Resp 18 | Ht 67.0 in | Wt 128.4 lb

## 2024-03-22 DIAGNOSIS — K91850 Pouchitis: Secondary | ICD-10-CM | POA: Diagnosis not present

## 2024-03-22 DIAGNOSIS — K5000 Crohn's disease of small intestine without complications: Secondary | ICD-10-CM

## 2024-03-22 MED ORDER — SODIUM CHLORIDE 0.9 % IV SOLN
600.0000 mg | Freq: Once | INTRAVENOUS | Status: AC
  Administered 2024-03-22: 600 mg via INTRAVENOUS
  Filled 2024-03-22: qty 10

## 2024-03-22 NOTE — Patient Instructions (Signed)
 Risankizumab Injection What is this medication? RISANKIZUMAB (RIS an KIZ ue mab) treats autoimmune conditions, such as psoriasis, arthritis, Crohn disease, and ulcerative colitis. It works by slowing down an overactive immune system.  It is a monoclonal antibody. This medicine may be used for other purposes; ask your health care provider or pharmacist if you have questions. COMMON BRAND NAME(S): Skyrizi What should I tell my care team before I take this medication? They need to know if you have any of these conditions: Hepatic disease Immune system problems Infection, such as tuberculosis (TB), bacterial, fungal or viral infections Recent or upcoming vaccine An unusual or allergic reaction to risankizumab, other medications, foods, dyes, or preservatives Pregnant or trying to get pregnant Breast-feeding How should I use this medication? This medication is injected into a vein or under the skin. It is given by your care team in a hospital or clinic setting. It may also be given at home. If you get this medication at home, you will be taught how to prepare and give it. Use exactly as directed. Take it as directed on the prescription label. Keep taking it unless your care team tells you to stop. If you use a pen, be sure to take off the outer needle cover before using the dose. It is important that you put your used needles and syringes in a special sharps container. Do not put them in a trash can. If you do not have a sharps container, call your pharmacist or care team to get one. A special MedGuide will be given to you by the pharmacist with each prescription and refill. Be sure to read this information carefully each time. This medication comes with INSTRUCTIONS FOR USE. Ask your pharmacist for directions on how to use this medication. Read the information carefully. Talk to your pharmacist or care team if you have questions. Talk to your care team about the use of this medication in children.  Special care may be needed. Overdosage: If you think you have taken too much of this medicine contact a poison control center or emergency room at once. NOTE: This medicine is only for you. Do not share this medicine with others. What if I miss a dose? It is important not to miss any doses. Talk to your care team about what to do if you miss a dose. What may interact with this medication? Do not take this medication with any of the following: Live vaccines This list may not describe all possible interactions. Give your health care provider a list of all the medicines, herbs, non-prescription drugs, or dietary supplements you use. Also tell them if you smoke, drink alcohol, or use illegal drugs. Some items may interact with your medicine. What should I watch for while using this medication? Visit your care team for regular checks on your progress. Tell your care team if your symptoms do not start to get better or if they get worse. You will be tested for tuberculosis (TB) before you start this medication. If your care team prescribes any medication for TB, you should start taking the TB medication before starting this medication. Make sure to finish the full course of TB medication. This medication may increase your risk of getting an infection. Call your care team for advice if you get a fever, chills, sore throat, or other symptoms of a cold or flu. Do not treat yourself. Try to avoid being around people who are sick. This medication can decrease the response to a vaccine. If you  need to get vaccinated, tell your care team if you have received this medication. Extra booster doses may be needed. Talk to your care team to see if a different vaccination schedule is needed. What side effects may I notice from receiving this medication? Side effects that you should report to your care team as soon as possible: Allergic reactions--skin rash, itching, hives, swelling of the face, lips, tongue, or  throat Infection--fever, chills, cough, sore throat, wounds that don't heal, pain or trouble when passing urine, general feeling of discomfort or being unwell Liver injury--right upper belly pain, loss of appetite, nausea, light-colored stool, dark yellow or brown urine, yellowing skin or eyes, unusual weakness or fatigue Side effects that usually do not require medical attention (report to your care team if they continue or are bothersome): Fatigue Headache Pain, redness, or irritation at injection site Runny or stuffy nose Sore throat This list may not describe all possible side effects. Call your doctor for medical advice about side effects. You may report side effects to FDA at 1-800-FDA-1088. Where should I keep my medication? Keep out of the reach of children and pets. Store in a refrigerator. Do not freeze. Protect from light. Keep it in the original carton until you are ready to take it. See product for storage information. Each product may have different instructions. Remove the dose from the carton about 30 to 45 minutes before it is time for you to take it. Get rid of any unused medication after the expiration date. To get rid of medications that are no longer needed or have expired: Take the medication to a medication take-back program. Check with your pharmacy or law enforcement to find a location. If you cannot return the medication, ask your pharmacist or care team how to get rid of this medication safely. NOTE: This sheet is a summary. It may not cover all possible information. If you have questions about this medicine, talk to your doctor, pharmacist, or health care provider.  2024 Elsevier/Gold Standard (2023-06-27 00:00:00)

## 2024-03-22 NOTE — Progress Notes (Signed)
 Diagnosis: Crohn's Disease  Provider:  Praveen Mannam MD  Procedure: IV Infusion  IV Type: Peripheral, IV Location: R Forearm  Skyrizi  (risankizumab -rzaa), Dose: 600 mg  Infusion Start Time: 1207  Infusion Stop Time: 1318  Post Infusion IV Care: Observation period completed and Peripheral IV Discontinued  Discharge: Condition: Good, Destination: Home . AVS Declined  Performed by:  Jelina Paulsen, RN

## 2024-03-24 ENCOUNTER — Ambulatory Visit: Admitting: Gastroenterology

## 2024-04-05 ENCOUNTER — Telehealth: Payer: Self-pay

## 2024-04-05 NOTE — Telephone Encounter (Signed)
 Follow up scheduled for Friday, 06/04/24 at 3:20 pm. Appt information mailed to patient.

## 2024-04-05 NOTE — Telephone Encounter (Signed)
-----   Message from Nurse Flowood B sent at 03/05/2024  1:14 PM EDT ----- Regarding: 3 month recall/Crohn's Disease. Patient needs a follow up in November with Dr. Cirigliano/Crohn's Disease.

## 2024-04-07 ENCOUNTER — Encounter (HOSPITAL_BASED_OUTPATIENT_CLINIC_OR_DEPARTMENT_OTHER): Payer: Self-pay | Admitting: Obstetrics & Gynecology

## 2024-04-07 ENCOUNTER — Ambulatory Visit (HOSPITAL_BASED_OUTPATIENT_CLINIC_OR_DEPARTMENT_OTHER): Admitting: Obstetrics & Gynecology

## 2024-04-07 VITALS — BP 103/62 | HR 79 | Wt 132.6 lb

## 2024-04-07 DIAGNOSIS — Z538 Procedure and treatment not carried out for other reasons: Secondary | ICD-10-CM

## 2024-04-07 DIAGNOSIS — Z30432 Encounter for removal of intrauterine contraceptive device: Secondary | ICD-10-CM | POA: Diagnosis not present

## 2024-04-07 DIAGNOSIS — Z319 Encounter for procreative management, unspecified: Secondary | ICD-10-CM

## 2024-04-07 NOTE — Progress Notes (Unsigned)
 GYNECOLOGY  VISIT  CC:   IUD removal  HPI: 33 y.o. G107P2002 Married White or Caucasian female here for IUD removal.   Past Medical History:  Diagnosis Date   Allergy    Migraines    Pouchitis (HCC)    Ulcerative colitis (HCC)     MEDS:   Current Outpatient Medications on File Prior to Visit  Medication Sig Dispense Refill   levonorgestrel  (MIRENA ) 20 MCG/DAY IUD 1 each by Intrauterine route once.     Risankizumab -rzaa (SKYRIZI ) 360 MG/2.4ML SOCT Inject 360 mg into the skin on week 12, then every 8 weeks afterwards 2.4 mL 4   ferrous sulfate  325 (65 FE) MG EC tablet Take 1 tablet (325 mg total) by mouth every other day. (Patient not taking: Reported on 04/07/2024)     No current facility-administered medications on file prior to visit.    ALLERGIES: Morphine  SH:  ***  ROS  PHYSICAL EXAMINATION:    BP 103/62 (BP Location: Right Arm, Patient Position: Sitting, Cuff Size: Normal)   Pulse 79   SpO2 100%     General appearance: alert, cooperative and appears stated age Neck: no adenopathy, supple, symmetrical, trachea midline and thyroid {CHL AMB PHY EX THYROID NORM DEFAULT:[phone]::normal to inspection and palpation} CV:  {Exam; heart brief:31539} Lungs:  {pe lungs ob:314451} Breasts: {Exam; breast:13139::normal appearance, no masses or tenderness} Abdomen: soft, non-tender; bowel sounds normal; no masses,  no organomegaly Lymph:  no inguinal LAD noted  Pelvic: External genitalia:  no lesions              Urethra:  normal appearing urethra with no masses, tenderness or lesions              Bartholins and Skenes: normal                 Vagina: {exam; pelvic vaginal:30846}              Cervix: {CHL AMB PHY EX CERVIX NORM DEFAULT:[phone]::no lesions}              Bimanual Exam:  Uterus:  {CHL AMB PHY EX UTERUS NORM [phone]::normal size, contour, position, consistency, mobility, non-tender}              Adnexa: {CHL AMB PHY EX ADNEXA NO MASS  DEFAULT:[phone]::no mass, fullness, tenderness}              Rectovaginal: {yes no:314532}.  Confirms.              Anus:  normal sphincter tone, no lesions  Chaperone, ***, CMA, was present for exam.  Assessment/Plan: There are no diagnoses linked to this encounter.

## 2024-04-14 ENCOUNTER — Other Ambulatory Visit (HOSPITAL_BASED_OUTPATIENT_CLINIC_OR_DEPARTMENT_OTHER): Payer: Self-pay

## 2024-04-14 MED ORDER — FLUZONE 0.5 ML IM SUSY
0.5000 mL | PREFILLED_SYRINGE | Freq: Once | INTRAMUSCULAR | 0 refills | Status: AC
  Filled 2024-04-14: qty 0.5, 1d supply, fill #0

## 2024-04-16 ENCOUNTER — Encounter (HOSPITAL_BASED_OUTPATIENT_CLINIC_OR_DEPARTMENT_OTHER): Payer: Self-pay | Admitting: Obstetrics & Gynecology

## 2024-04-20 ENCOUNTER — Ambulatory Visit (INDEPENDENT_AMBULATORY_CARE_PROVIDER_SITE_OTHER)

## 2024-04-20 VITALS — BP 99/56 | HR 65 | Temp 99.2°F | Resp 16 | Ht 67.0 in | Wt 131.6 lb

## 2024-04-20 DIAGNOSIS — K5000 Crohn's disease of small intestine without complications: Secondary | ICD-10-CM

## 2024-04-20 DIAGNOSIS — K91850 Pouchitis: Secondary | ICD-10-CM | POA: Diagnosis not present

## 2024-04-20 MED ORDER — SODIUM CHLORIDE 0.9 % IV SOLN
600.0000 mg | Freq: Once | INTRAVENOUS | Status: AC
  Administered 2024-04-20: 600 mg via INTRAVENOUS
  Filled 2024-04-20: qty 10

## 2024-04-20 NOTE — Progress Notes (Signed)
 Diagnosis: Crohn's Disease  Provider:  Mannam, Praveen MD  Procedure: IV Infusion  IV Type: Peripheral, IV Location: R Forearm  Skyrizi  (risankizumab -rzaa), Dose: 600 mg  Infusion Start Time: 0912  Infusion Stop Time: 1017  Post Infusion IV Care: Peripheral IV Discontinued  Discharge: Condition: Stable, Destination: Home . AVS Declined  Performed by:  Rocky FORBES Sar, RN

## 2024-04-21 ENCOUNTER — Ambulatory Visit (HOSPITAL_BASED_OUTPATIENT_CLINIC_OR_DEPARTMENT_OTHER): Admitting: Obstetrics & Gynecology

## 2024-04-22 ENCOUNTER — Other Ambulatory Visit (HOSPITAL_COMMUNITY): Payer: Self-pay

## 2024-04-29 ENCOUNTER — Encounter: Payer: Self-pay | Admitting: Gastroenterology

## 2024-04-29 ENCOUNTER — Other Ambulatory Visit (HOSPITAL_COMMUNITY): Payer: Self-pay

## 2024-05-10 ENCOUNTER — Other Ambulatory Visit (HOSPITAL_BASED_OUTPATIENT_CLINIC_OR_DEPARTMENT_OTHER): Payer: Self-pay

## 2024-05-10 MED ORDER — MISOPROSTOL 200 MCG PO TABS
ORAL_TABLET | ORAL | 0 refills | Status: DC
  Filled 2024-05-10: qty 2, 2d supply, fill #0

## 2024-05-10 NOTE — Progress Notes (Signed)
 SABRA

## 2024-05-12 ENCOUNTER — Encounter (HOSPITAL_BASED_OUTPATIENT_CLINIC_OR_DEPARTMENT_OTHER): Payer: Self-pay | Admitting: Obstetrics & Gynecology

## 2024-05-12 ENCOUNTER — Ambulatory Visit (HOSPITAL_BASED_OUTPATIENT_CLINIC_OR_DEPARTMENT_OTHER)

## 2024-05-12 ENCOUNTER — Other Ambulatory Visit (HOSPITAL_BASED_OUTPATIENT_CLINIC_OR_DEPARTMENT_OTHER): Payer: Self-pay | Admitting: Obstetrics & Gynecology

## 2024-05-12 ENCOUNTER — Ambulatory Visit (HOSPITAL_BASED_OUTPATIENT_CLINIC_OR_DEPARTMENT_OTHER): Admitting: Obstetrics & Gynecology

## 2024-05-12 DIAGNOSIS — K5000 Crohn's disease of small intestine without complications: Secondary | ICD-10-CM

## 2024-05-12 DIAGNOSIS — Z98891 History of uterine scar from previous surgery: Secondary | ICD-10-CM

## 2024-05-12 DIAGNOSIS — Z30432 Encounter for removal of intrauterine contraceptive device: Secondary | ICD-10-CM

## 2024-05-15 ENCOUNTER — Encounter (HOSPITAL_BASED_OUTPATIENT_CLINIC_OR_DEPARTMENT_OTHER): Payer: Self-pay | Admitting: Obstetrics & Gynecology

## 2024-05-15 MED ORDER — PRENATAL VITAMINS 28-0.8 MG PO TABS: 1.0000 | ORAL_TABLET | Freq: Every day | ORAL | Status: AC

## 2024-05-15 NOTE — Progress Notes (Signed)
   CC:  IUD removal.  Desires pregnancy.  33 y.o. G34P2002 Married Caucasian female presents for removal of Mirena  IUD with ultrasound guidance due to non visualized strings.  Pt has taken ibuprofen  and cytotec today prior to procedure.  She and spouse are ready to try again for pregnancy.  PNV recommended.  She is nervous.  Pt has also been counseled about risks and benefits as well as complications.  Consent is obtained today.  All questions answered prior to start of procedure.    LMP:  No LMP recorded. (Menstrual status: IUD).  Patient Active Problem List   Diagnosis Date Noted   Crohn disease of ileum (HCC) 03/05/2024   Adhesion of abdominal wall 05/02/2023   IUD (intrauterine device) in place 10/01/2022   Pouchitis (HCC) 06/05/2021   Fibrocystic disease of breast 06/04/2021   Partial small bowel obstruction (HCC) 06/04/2021   History of C-section 04/17/2021   Migraine without status migrainosus, not intractable 09/12/2017   Ulcerative colitis with complication (HCC) 01/28/2017    Gen:  [facility] healthy female NAD Groin:  no inguinal nodes palpated  Pelvic exam: Vulva:  normal female genitalia Vagina:  normal vagina Cervix:  Non-tender, Negative CMT, no lesions or redness.  Procedure:  Prior to proedure, ultrasound was performed showing IUD string in the cervix possible 1-2cm.  Speculum placed.  Cervix cleansed with Betadine  x 3.  Long tonsil forcep passed into cervical os.  String grasped with second attempt and IUD removed intact and without difficulty.  Pt tolerated procedure well.  Pt visualized IUD prior to discarding.  Speculum removed.    Assessment/Plan: 1. Encounter for IUD removal (Primary) - Removal successful today.  Recommended starting PNV daily.  2. Crohn's disease of ileum without complication (HCC) - have reached out to Dr. San about her Skyrizi .  Will try again and also to addition GI for recommendations  3. History of C-section

## 2024-05-17 ENCOUNTER — Encounter: Payer: Self-pay | Admitting: Gastroenterology

## 2024-05-17 ENCOUNTER — Other Ambulatory Visit (HOSPITAL_COMMUNITY): Payer: Self-pay

## 2024-05-18 ENCOUNTER — Ambulatory Visit

## 2024-05-18 ENCOUNTER — Encounter (HOSPITAL_BASED_OUTPATIENT_CLINIC_OR_DEPARTMENT_OTHER): Payer: Self-pay | Admitting: Obstetrics & Gynecology

## 2024-05-18 VITALS — BP 109/70 | HR 76 | Temp 98.2°F | Resp 16 | Ht 67.0 in | Wt 135.4 lb

## 2024-05-18 DIAGNOSIS — K91850 Pouchitis: Secondary | ICD-10-CM

## 2024-05-18 DIAGNOSIS — K5000 Crohn's disease of small intestine without complications: Secondary | ICD-10-CM

## 2024-05-18 MED ORDER — SODIUM CHLORIDE 0.9 % IV SOLN
600.0000 mg | Freq: Once | INTRAVENOUS | Status: AC
  Administered 2024-05-18: 600 mg via INTRAVENOUS
  Filled 2024-05-18: qty 10

## 2024-05-18 NOTE — Progress Notes (Signed)
 Diagnosis: Crohn's Disease  Provider:  Praveen Mannam MD  Procedure: IV Infusion  IV Type: Peripheral, IV Location: R Forearm  Skyrizi  (risankizumab -rzaa), Dose: 600 mg  Infusion Start Time: 0905  Infusion Stop Time: 1005  Post Infusion IV Care: Peripheral IV Discontinued  Discharge: Condition: Good, Destination: Home . AVS Declined  Performed by:  Jaleyah Longhi, RN

## 2024-05-25 ENCOUNTER — Other Ambulatory Visit (INDEPENDENT_AMBULATORY_CARE_PROVIDER_SITE_OTHER)

## 2024-05-25 ENCOUNTER — Other Ambulatory Visit: Payer: Self-pay

## 2024-05-25 ENCOUNTER — Other Ambulatory Visit

## 2024-05-25 DIAGNOSIS — E559 Vitamin D deficiency, unspecified: Secondary | ICD-10-CM

## 2024-05-25 DIAGNOSIS — R790 Abnormal level of blood mineral: Secondary | ICD-10-CM

## 2024-05-25 DIAGNOSIS — D509 Iron deficiency anemia, unspecified: Secondary | ICD-10-CM

## 2024-05-25 LAB — CBC
HCT: 30.3 % — ABNORMAL LOW (ref 36.0–46.0)
Hemoglobin: 9.2 g/dL — ABNORMAL LOW (ref 12.0–15.0)
MCHC: 30.3 g/dL (ref 30.0–36.0)
MCV: 67.2 fl — ABNORMAL LOW (ref 78.0–100.0)
Platelets: 528 K/uL — ABNORMAL HIGH (ref 150.0–400.0)
RBC: 4.51 Mil/uL (ref 3.87–5.11)
RDW: 18.7 % — ABNORMAL HIGH (ref 11.5–15.5)
WBC: 4.1 K/uL (ref 4.0–10.5)

## 2024-05-25 LAB — IBC + FERRITIN
Ferritin: 1.9 ng/mL — ABNORMAL LOW (ref 10.0–291.0)
Iron: 4 ug/dL — ABNORMAL LOW (ref 42–145)
Saturation Ratios: 0.8 % — ABNORMAL LOW (ref 20.0–50.0)
TIBC: 485.8 ug/dL — ABNORMAL HIGH (ref 250.0–450.0)
Transferrin: 347 mg/dL (ref 212.0–360.0)

## 2024-05-25 LAB — VITAMIN D 25 HYDROXY (VIT D DEFICIENCY, FRACTURES): VITD: 29.81 ng/mL — ABNORMAL LOW (ref 30.00–100.00)

## 2024-05-31 ENCOUNTER — Encounter: Payer: Self-pay | Admitting: Radiology

## 2024-06-01 ENCOUNTER — Other Ambulatory Visit (HOSPITAL_COMMUNITY): Payer: Self-pay

## 2024-06-02 ENCOUNTER — Other Ambulatory Visit: Payer: Self-pay

## 2024-06-02 ENCOUNTER — Other Ambulatory Visit (HOSPITAL_COMMUNITY): Payer: Self-pay

## 2024-06-04 ENCOUNTER — Ambulatory Visit: Admitting: Gastroenterology

## 2024-06-07 ENCOUNTER — Ambulatory Visit: Payer: Self-pay | Admitting: Gastroenterology

## 2024-06-08 ENCOUNTER — Other Ambulatory Visit: Payer: Self-pay | Admitting: Pharmacist

## 2024-06-08 ENCOUNTER — Ambulatory Visit: Attending: Internal Medicine | Admitting: Pharmacist

## 2024-06-08 ENCOUNTER — Other Ambulatory Visit: Payer: Self-pay

## 2024-06-08 DIAGNOSIS — D509 Iron deficiency anemia, unspecified: Secondary | ICD-10-CM

## 2024-06-08 DIAGNOSIS — R790 Abnormal level of blood mineral: Secondary | ICD-10-CM

## 2024-06-08 DIAGNOSIS — Z7189 Other specified counseling: Secondary | ICD-10-CM

## 2024-06-08 MED ORDER — SKYRIZI 360 MG/2.4ML ~~LOC~~ SOCT
SUBCUTANEOUS | 4 refills | Status: DC
  Filled 2024-06-08: qty 2.4, fill #0

## 2024-06-08 NOTE — Progress Notes (Signed)
 See OV from 06/08/24.   Brooke Mckee, PharmD, JAQUELINE, CPP Clinical Pharmacist [facility] & [facility] [phone]

## 2024-06-08 NOTE — Progress Notes (Signed)
   S: Patient presents for review of their specialty medication therapy.  Patient is currently taking Skyrizi  for Crohn's. Patient is managed by Dr. San for this.   Adherence: confirms. Completed IV induction phase with last IV infusion done 05/18/2024.   Efficacy: reports good control with the medication  Dosing:  Crohn's: 600 mg via IV infusion over 1 hour at week 0, 4, and week 8 followed by 360 mg subcutaneous at week 12 and then once every 8 weeks thereafter.   Screening: TB test: completed   Monitoring: S/sx of infection: none  S/sx of hypersensitivity/injection site reaction: none   O:     Lab Results  Component Value Date   WBC 4.1 05/25/2024   HGB 9.2 (L) 05/25/2024   HCT 30.3 (L) 05/25/2024   MCV 67.2 Repeated and verified X2. (L) 05/25/2024   PLT 528.0 (H) 05/25/2024      Chemistry      Component Value Date/Time   NA 139 01/22/2024 1411   K 4.3 01/22/2024 1411   CL 104 01/22/2024 1411   CO2 27 01/22/2024 1411   BUN 13 01/22/2024 1411   CREATININE 0.65 01/22/2024 1411      Component Value Date/Time   CALCIUM 9.3 01/22/2024 1411   ALKPHOS 71 08/05/2023 1607   AST 11 08/05/2023 1607   ALT 12 08/05/2023 1607   BILITOT 0.2 08/05/2023 1607       A/P: 1. Medication review: Patient currently on Skyrizi  for Crohn's. Reviewed the medication with the patient, including the following: Skyrizi  is a monoclonal antibody used in the treatment of Crohn's. Patient educated on purpose, proper use and potential adverse effects of Skyrizi . Possible adverse effects are infections, headache, and injection site reactions. Live vaccinations should be avoided while on therapy. SubQ: Administer the two consecutive injections subcutaneously at different anatomic locations, such as thighs, abdomen, or back of upper arms. Do not inject into areas where the skin is tender, bruised, red, hard, or affected by psoriasis. Intended for use under supervision of a health care  professional; self-injection may occur after proper training (except back of upper arms). No recommendations for any changes at this time.  Brooke Mckee, PharmD, Brooke Mckee, CPP Clinical Pharmacist [facility] & [facility] [phone]

## 2024-06-09 ENCOUNTER — Other Ambulatory Visit: Payer: Self-pay

## 2024-06-09 ENCOUNTER — Encounter: Payer: Self-pay | Admitting: Gastroenterology

## 2024-06-09 ENCOUNTER — Ambulatory Visit (INDEPENDENT_AMBULATORY_CARE_PROVIDER_SITE_OTHER): Admitting: Gastroenterology

## 2024-06-09 VITALS — BP 104/62 | HR 69 | Ht 67.0 in | Wt 133.5 lb

## 2024-06-09 DIAGNOSIS — K5000 Crohn's disease of small intestine without complications: Secondary | ICD-10-CM | POA: Diagnosis not present

## 2024-06-09 DIAGNOSIS — R109 Unspecified abdominal pain: Secondary | ICD-10-CM | POA: Diagnosis not present

## 2024-06-09 DIAGNOSIS — D509 Iron deficiency anemia, unspecified: Secondary | ICD-10-CM | POA: Diagnosis not present

## 2024-06-09 DIAGNOSIS — E559 Vitamin D deficiency, unspecified: Secondary | ICD-10-CM | POA: Diagnosis not present

## 2024-06-09 DIAGNOSIS — R198 Other specified symptoms and signs involving the digestive system and abdomen: Secondary | ICD-10-CM | POA: Diagnosis not present

## 2024-06-09 NOTE — Progress Notes (Signed)
 Chief Complaint:    Crohn's disease  GI History: 33 year old female with a history of Ulcerative Colitis s/p laparoscopic total colectomy with ileostomy in 2012 and subsequent reversal with J-pouch.  Has been previously treated with Remicade and was a primary nonresponder.  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  History of iron deficiency anemia, previously treated with IV iron and PRBC transfusion 2012.  Not on any maintenance therapy.  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   - 11/2015: Flexible sigmoidoscopy: Pouchitis.  Treated with Cipro /Flagyl  - 05/2021: Hospital admission with pouchitis while [redacted] weeks pregnant.  Negative GI PCR panel.  CRP 9.9, calprotectin 603 - 06/04/2021: CT A/P: S/p colectomy and ileoanal anastomosis.  Moderate dilation of SB proximal to anastomosis s/o pSBO.  No inflammation of J-pouch or SB -06/05/2021: Flexible sigmoidoscopy: Mild, distal pouchitis (path: Chronic active enteritis without granulomata).  Healthy-appearing anastomosis.  Normal-appearing small bowel (path: Chronic active enteritis).  Treated with Flagyl  monotherapy due to pregnancy - 10/2021: Fecal calprotectin 45.  GI PCR panel positive for astrovirus, otherwise negative including C. Difficile - 12/2023: Follow-up in the GI clinic.  Episodic flares of tenesmus, straining.  Labs with IDA and started on ferrous sulfate , vitamin D  21.5 started on ergocalciferol .  ESR 33, normal CRP, normal calprotectin at 26 - 12/2023: QuantiFERON gold negative, appropriate immunity to hepatitis B - 02/2024: EGD: Normal - 02/2024: Colonoscopy: J-pouch characterized by moderate erythema, edema, deep ulcerations (path: chronic, active colitis with moderate to severe activity), Colonoscope advanced deep into the small bowel 75 cm from anal verge with scattered areas of moderate inflammation at 40-55 cm and 25-30 cm with edema,  erythema, deep ulcerations (path: Moderate chronic, active ileitis with moderate to severe activity), all consistent with Crohn's reclassification.  Started on Skyrizi      Family history notable for father with UC, paternal uncle with UC, diagnosed colon cancer.  Sister with Crohn Disease.  HPI:     Patient is a 33 y.o. female presenting to the Gastroenterology Clinic for follow-up.  She was last seen by me in the office on 01/22/2024 and was c/o episodic flares every 1-2 months (tenesmus, straining).  No response to trial of rifaximin . Labs notable for IDA and vitamin D  deficiency.  Subsequent EGD normal but colonoscopy with moderate inflammation and biopsies showing moderate to severe activity all consistent with Crohn's reclassification.  She was started on Skyrizi .  Repeat labs 2 weeks ago with continued and actually worsening iron deficiency anemia despite oral iron and was referred to the Hematology Clinic for IV iron.  Vitamin D  improving with ergocalciferol .  Today, she states Skyrizi  isn't working.  Continues to have tenesmus and cramping.  No hematochezia.  Did have a couple episodes of URI since starting Skyrizi  as well.    She was seen by Dr. Cleotilde in the OB/GYN clinic and IUD removed as she and her husband are hoping to conceive again.   Review of systems:     No chest pain, no SOB, no fevers, no urinary sx   Past Medical History:  Diagnosis Date   Allergy    Crohn disease (HCC)    Migraines    Pouchitis (HCC)    Ulcerative colitis (HCC)     Patient's surgical history, family medical history, social history, medications and allergies were all reviewed in Epic    Current Outpatient Medications  Medication Sig Dispense Refill   Prenatal Vit-Fe Fumarate-FA (PRENATAL VITAMINS)  28-0.8 MG TABS Take 1 tablet by mouth daily.     Risankizumab -rzaa (SKYRIZI ) 360 MG/2.4ML SOCT Inject 360 mg into the skin on week 12, then every 8 weeks afterwards 2.4 mL 4   No current  facility-administered medications for this visit.    Physical Exam:     BP 104/62 (BP Location: Right Arm, Patient Position: Sitting, Cuff Size: Normal)   Pulse 69   Ht 5' 7 (1.702 m)   Wt 133 lb 8 oz (60.6 kg)   BMI 20.91 kg/m   GENERAL:  Pleasant female in NAD PSYCH: : Cooperative, normal affect NEURO: Alert and oriented x 3, no focal neurologic deficits   IMPRESSION and PLAN:    1) Inflammatory Bowel Disease 2) History of recurrent pouchitis 33 year old female with history of previously diagnosed UC (primary nonresponder to Remicade) s/p laparoscopic total colectomy with ileostomy in 2012 and subsequent reversal with J-pouch.  Since then, had been having episodic recurrent pouchitis, typically 1-2 times per year which typically resolved with antibiotics (Cipro /Flagyl ).  Developed more frequent flares with shorter intervals in 2025 and underwent EGD (normal) and colonoscopy (moderate inflammation with deep ulcers in the small bowel, anastomosis, and pouch all highly suspicious for Crohn's reclassification.  Interestingly, CRP and calprotectin were normal despite symptoms and active inflammation, with only mildly elevated ESR at 33).  Was started on Skyrizi , but reports no appreciable clinical improvement with continued tenesmus, abdominal cramping, along with worsening iron deficiency anemia.  We discussed medication options at length today.  While anti-TNF's are safe to use in pregnancy and would typically reach for Cimzia, as she was a primary nonresponder to Remicade, so there is a concern that this may not be efficacious as a class effect.   Stelara can typically be continued in pregnancy, but might not be the best option to initiate as she is hoping to conceive again.  Typically avoid small molecule agents (ie, Upadacitinib) as these can cross the placenta.  Discussed possibly starting on vedolizumab, starting with IV and potentially moving to subcutaneous.  I will plan to discuss  her case further with one of my partners well-versed  in complex IBD patients.  - Check fecal calprotectin, ESR, CRP today - QuantiFERON gold negative and has appropriate immunity to hepatitis B on labs earlier this year - As above, may potentially start on vedolizumab.  I will contact patient by phone after had a chance to collaborate further on her complex case  3) Iron deficiency anemia Secondary to active IBD. - Referral in place to the Hematology Clinic for IV iron  4) Vitamin D  deficiency - Labs improving - Continue OTC vitamin D         I spent 45 minutes of time, including in depth chart review, independent review of results as outlined above, communicating results with the patient directly, face-to-face time with the patient, coordinating care, discussion of complex care with colleagues, ordering studies and medications as appropriate, and documentation.    Sandor LULLA Flatter ,DO, FACG 06/09/2024, 8:52 AM

## 2024-06-09 NOTE — Patient Instructions (Signed)
 _______________________________________________________  If your blood pressure at your visit was 140/90 or greater, please contact your primary care physician to follow up on this.  _______________________________________________________  If you are age 35 or older, your body mass index should be between 23-30. Your Body mass index is 20.91 kg/m. If this is out of the aforementioned range listed, please consider follow up with your Primary Care Provider.  If you are age 71 or younger, your body mass index should be between 19-25. Your Body mass index is 20.91 kg/m. If this is out of the aformentioned range listed, please consider follow up with your Primary Care Provider.   ________________________________________________________  The [facility] providers would like to encourage you to use MYCHART to communicate with providers for non-urgent requests or questions.  Due to long hold times on the telephone, sending your provider a message by [facility] may be a faster and more efficient way to get a response.  Please allow 48 business hours for a response.  Please remember that this is for non-urgent requests.  _______________________________________________________  [facility] is using a team-based approach to care.  Your team is made up of your doctor and two to three APPS. Our APPS (Nurse Practitioners and Physician Assistants) work with your physician to ensure care continuity for you. They are fully qualified to address your health concerns and develop a treatment plan. They communicate directly with your gastroenterologist to care for you. Seeing the Advanced Practice Practitioners on your physician's team can help you by facilitating care more promptly, often allowing for earlier appointments, access to diagnostic testing, procedures, and other specialty referrals.   Your provider has requested that you go to the basement level for lab work before leaving today. Press B on the  elevator. The lab is located at the first door on the left as you exit the elevator.  Due to recent changes in healthcare laws, you may see the results of your imaging and laboratory studies on MyChart before your provider has had a chance to review them.  We understand that in some cases there may be results that are confusing or concerning to you. Not all laboratory results come back in the same time frame and the provider may be waiting for multiple results in order to interpret others.  Please give us  48 hours in order for your provider to thoroughly review all the results before contacting the office for clarification of your results.   It was a pleasure to see you today!  Vito Cirigliano, D.O.

## 2024-06-11 ENCOUNTER — Telehealth: Payer: Self-pay

## 2024-06-11 NOTE — Telephone Encounter (Signed)
-----   Message from Sandor LULLA Flatter sent at 06/10/2024  5:42 PM EST ----- I spoke with Brooke Mckee on the phone today and we will plan on starting her on Entyvio.  Can we please place orders for Entyvio 300 mg IV at week 0, 2, 6 weeks, then every 8 weeks for maintenance therapy for Crohn's disease.  To stop Skyrizi  when starting the Entyvio.  Thanks!

## 2024-06-15 ENCOUNTER — Encounter: Payer: Self-pay | Admitting: Gastroenterology

## 2024-06-15 NOTE — Addendum Note (Signed)
 Addended by: Emmakate Hypes N on: 06/15/2024 12:54 PM   Modules accepted: Orders

## 2024-06-15 NOTE — Telephone Encounter (Signed)
 [place], Our team will submit the auth and f/u once we have a response. Thanks :)

## 2024-06-15 NOTE — Telephone Encounter (Signed)
 Entyvio therapy plan orders entered in epic.

## 2024-06-16 ENCOUNTER — Telehealth: Payer: Self-pay

## 2024-06-16 NOTE — Telephone Encounter (Signed)
 Dr. San, patient will be scheduled as soon as possible.  Auth Submission: APPROVED Site of care: Site of care: [facility] Payer: [employer] commercial Medication & CPT/J Code(s) submitted: Entyvio (Vedolizumab) [id number] Diagnosis Code:  Route of submission (phone, fax, portal): Latent Phone # Fax # Auth type: Buy/Bill PB Units/visits requested: 300mg  on week 0,2 and 6 then every 8 weeks thereafter. Reference number: [id number] Approval from: 06/16/24 to 06/15/25   Approval letter is in the media tab

## 2024-06-18 ENCOUNTER — Inpatient Hospital Stay: Attending: Hematology and Oncology | Admitting: Hematology and Oncology

## 2024-06-18 ENCOUNTER — Inpatient Hospital Stay

## 2024-06-18 ENCOUNTER — Other Ambulatory Visit (HOSPITAL_COMMUNITY): Payer: Self-pay | Admitting: Hematology and Oncology

## 2024-06-18 VITALS — BP 131/74 | HR 64 | Temp 97.8°F | Resp 13 | Ht 67.0 in | Wt 134.1 lb

## 2024-06-18 DIAGNOSIS — D500 Iron deficiency anemia secondary to blood loss (chronic): Secondary | ICD-10-CM | POA: Insufficient documentation

## 2024-06-18 DIAGNOSIS — K5090 Crohn's disease, unspecified, without complications: Secondary | ICD-10-CM | POA: Insufficient documentation

## 2024-06-18 LAB — CBC WITH DIFFERENTIAL (CANCER CENTER ONLY)
Abs Immature Granulocytes: 0.01 K/uL (ref 0.00–0.07)
Basophils Absolute: 0.1 K/uL (ref 0.0–0.1)
Basophils Relative: 1 %
Eosinophils Absolute: 0.1 K/uL (ref 0.0–0.5)
Eosinophils Relative: 1 %
HCT: 32.3 % — ABNORMAL LOW (ref 36.0–46.0)
Hemoglobin: 9.7 g/dL — ABNORMAL LOW (ref 12.0–15.0)
Immature Granulocytes: 0 %
Lymphocytes Relative: 39 %
Lymphs Abs: 2.2 K/uL (ref 0.7–4.0)
MCH: 20.1 pg — ABNORMAL LOW (ref 26.0–34.0)
MCHC: 30 g/dL (ref 30.0–36.0)
MCV: 67 fL — ABNORMAL LOW (ref 80.0–100.0)
Monocytes Absolute: 0.5 K/uL (ref 0.1–1.0)
Monocytes Relative: 9 %
Neutro Abs: 2.9 K/uL (ref 1.7–7.7)
Neutrophils Relative %: 50 %
Platelet Count: 542 K/uL — ABNORMAL HIGH (ref 150–400)
RBC: 4.82 MIL/uL (ref 3.87–5.11)
RDW: 17.8 % — ABNORMAL HIGH (ref 11.5–15.5)
WBC Count: 5.8 K/uL (ref 4.0–10.5)
nRBC: 0 % (ref 0.0–0.2)

## 2024-06-18 LAB — CMP (CANCER CENTER ONLY)
ALT: 12 U/L (ref 0–44)
AST: 20 U/L (ref 15–41)
Albumin: 4.6 g/dL (ref 3.5–5.0)
Alkaline Phosphatase: 49 U/L (ref 38–126)
Anion gap: 10 (ref 5–15)
BUN: 11 mg/dL (ref 6–20)
CO2: 24 mmol/L (ref 22–32)
Calcium: 9.9 mg/dL (ref 8.9–10.3)
Chloride: 105 mmol/L (ref 98–111)
Creatinine: 0.7 mg/dL (ref 0.44–1.00)
GFR, Estimated: >60 mL/min (ref >60)
Glucose, Bld: 75 mg/dL (ref 70–99)
Potassium: 4.1 mmol/L (ref 3.5–5.1)
Sodium: 138 mmol/L (ref 135–145)
Total Bilirubin: 0.3 mg/dL (ref 0.0–1.2)
Total Protein: 8.1 g/dL (ref 6.5–8.1)

## 2024-06-18 LAB — RETIC PANEL
Immature Retic Fract: 8.1 % (ref 2.3–15.9)
RBC.: 4.79 MIL/uL (ref 3.87–5.11)
Retic Count, Absolute: 40.2 K/uL (ref 19.0–186.0)
Retic Ct Pct: 0.8 % (ref 0.4–3.1)
Reticulocyte Hemoglobin: 19.4 pg — ABNORMAL LOW (ref >27.9)

## 2024-06-18 LAB — IRON AND IRON BINDING CAPACITY (CC-WL,HP ONLY)
Iron: 15 ug/dL — ABNORMAL LOW (ref 28–170)
Saturation Ratios: 3 % — ABNORMAL LOW (ref 10.4–31.8)
TIBC: 490 ug/dL — ABNORMAL HIGH (ref 250–450)
UIBC: 475 ug/dL

## 2024-06-18 LAB — VITAMIN B12: Vitamin B-12: 1155 pg/mL — ABNORMAL HIGH (ref 180–914)

## 2024-06-18 LAB — FOLATE: Folate: >20 ng/mL (ref >5.9)

## 2024-06-18 LAB — FERRITIN: Ferritin: 4 ng/mL — ABNORMAL LOW (ref 11–307)

## 2024-06-18 NOTE — Progress Notes (Signed)
 [facility] [phone]   [phone]  INITIAL CONSULT NOTE  Patient Care Team: Pcp, No as PCP - General Ccs, Md, MD as Consulting Physician (General Surgery)  Hematological/Oncological History # Iron Deficiency Anemia 2/2 to GYN Bleeding  05/25/2024: WBC 4.1 ,Hgb 9.2, MCV 67.2, Plt 528. Iron sat 0.8%, Ferritin 1.9  06/18/2024: establish care with Dr. Federico   CHIEF COMPLAINTS/PURPOSE OF CONSULTATION:  Iron Deficiency Anemia 2/2 to GYN Bleeding    HISTORY OF PRESENTING ILLNESS:  Brooke Mckee 33 y.o. female with medical history significant for ulcerative colitis with recurrent Crohn's disease, migraines, and iron deficiency anemia who presents for evaluation.  On review of the previous records Mrs. Weight had labs drawn on 05/25/2024 which showed a white blood cell count 4.1, hemoglobin 9.2, MCV 67.2, with platelets of 528.  Looking back in the records she had a white blood cell count of 12.4, hemoglobin 9.2, MCV 59.6, and platelets of 1063 on 08/05/2023.  She also follows with [facility] for her recently diagnosed Crohn's disease.  She has undergone colectomy and had an ostomy takedown.  Due to concern for iron deficiency anemia she was referred to hematology for further evaluation and management.  On exam today [name] reports that she has a history of ulcerative colitis in the past and underwent a colectomy.  She was diagnosed in 2008.  She reports that recently she was unfortunately diagnosed with Crohn's disease.  She is currently following with [facility] gastroenterology.  She reports that she has had issues with anemia her whole life.  She notes that she does take iron pills but they never seem to help.  She reports that she has never received an iron infusion before but has received several blood transfusions.  She is having issues with lightheadedness, dizziness, shortness of breath.  She is also having mental fogging.  She has  been having some issues with fainting as well, particularly during activities of long standing or excitement.  She reports that she has 2 children ages 2 and 5.  She reports that she is not having any bleeding anywhere else such as nosebleeds, gum bleeding, or blood in the urine/stool.  She reports that she has had very light menstrual cycles. \ On further discussion she reports her mother is healthy but there is colitis and colon disease on the father side of the family.  She reports her dad has colitis, sister has Crohn's, and her paternal uncle died from colon surgery.  She reports that she is a never smoker never drinker.  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  She reports that she is not currently with any musical group but does sing for nursing communities.  Otherwise she denies any fevers, chills, sweats, nausea, vomiting or diarrhea.  Full 10 point ROS otherwise negative.  MEDICAL HISTORY:  Past Medical History:  Diagnosis Date   Allergy    Crohn disease (HCC)    Migraines    Pouchitis (HCC)    Ulcerative colitis (HCC)     SURGICAL HISTORY: Past Surgical History:  Procedure Laterality Date   BIOPSY  06/05/2021   Procedure: BIOPSY;  Surgeon: San Sandor GAILS, DO;  Location: [facility];  Service: Gastroenterology;;   CESAREAN SECTION     CESAREAN SECTION N/A 07/11/2021   Procedure: CESAREAN SECTION;  Surgeon: Eveline Lynwood MATSU, MD;  Location: [facility];  Service: Obstetrics;  Laterality: N/A;   EXCISION MASS ABDOMINAL N/A 07/09/2023  Procedure: excision of abdominal adhesions;  Surgeon: Lowery Estefana RAMAN, DO;  Location: [facility];  Service: Plastics;  Laterality: N/A;   FLEXIBLE SIGMOIDOSCOPY N/A 06/05/2021   Procedure: FLEXIBLE SIGMOIDOSCOPY;  Surgeon: San Sandor GAILS, DO;  Location: [facility];  Service: Gastroenterology;  Laterality: N/A;  unsedated procedure   ILEAL POUCH  2012   [place]   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  Other Topics Concern   Not on file  Social History Narrative   Not on file   Social Drivers of Health   Financial Resource Strain: Not on file  Food Insecurity: No Food Insecurity (06/14/2024)   Hunger Vital Sign    Worried About Running Out of Food in the Last Year: Never true    Ran Out of Food in the Last Year: Never true  Transportation Needs: No Transportation Needs (06/14/2024)   PRAPARE - Administrator, Civil Service (Medical): No    Lack of Transportation (Non-Medical): No  Physical Activity: Not on file  Stress: Not on file  Social Connections: Not on file  Intimate Partner Violence: Not on file    FAMILY HISTORY: Family History  Problem Relation Age of Onset   Colitis Father    Colon cancer Paternal Uncle    Rectal cancer Neg Hx    Esophageal cancer Neg Hx    Stomach cancer Neg Hx     ALLERGIES:  is allergic to morphine.  MEDICATIONS:  Current Outpatient Medications  Medication Sig Dispense Refill   Prenatal Vit-Fe Fumarate-FA (PRENATAL VITAMINS) 28-0.8 MG TABS Take 1 tablet by mouth daily.     No current facility-administered medications for this visit.    REVIEW OF SYSTEMS:   Constitutional: ( - ) fevers, ( - )  chills , ( - ) night sweats Eyes: ( - ) blurriness of vision, ( - ) double vision, ( - ) watery eyes Ears, nose, mouth, throat, and face: ( - ) mucositis, ( - ) sore throat Respiratory: ( - ) cough, ( - )  dyspnea, ( - ) wheezes Cardiovascular: ( - ) palpitation, ( - ) chest discomfort, ( - ) lower extremity swelling Gastrointestinal:  ( - ) nausea, ( - ) heartburn, ( - ) change in bowel habits Skin: ( - ) abnormal skin rashes Lymphatics: ( - ) new lymphadenopathy, ( - ) easy bruising Neurological: ( - ) numbness, ( - ) tingling, ( - ) new weaknesses Behavioral/Psych: ( - ) mood change, ( - ) new changes  All other systems were reviewed with the patient and are negative.  PHYSICAL EXAMINATION:  Vitals:   06/18/24 0902  BP: 131/74  Pulse: 64  Resp: 13  Temp: 97.8 F (36.6 C)  SpO2: 100%   Filed Weights   06/18/24 0902  Weight: 134 lb 1.6 oz (60.8 kg)  GENERAL: well appearing middle-age female in NAD  SKIN: skin color, texture, turgor are normal, no rashes or significant lesions EYES: conjunctiva are pink and non-injected, sclera clear LUNGS: clear to auscultation and percussion with normal breathing effort HEART: regular rate & rhythm and no murmurs and no lower extremity edema Musculoskeletal: no cyanosis of digits and no clubbing  PSYCH: alert & oriented x 3, fluent speech NEURO: no focal motor/sensory deficits  LABORATORY DATA:  I have reviewed the data as listed    Latest Ref Rng & Units 05/25/2024    9:28 AM 01/22/2024    2:11 PM 08/12/2023    8:38 AM  CBC  WBC 4.0 - 10.5 K/uL 4.1  6.5  7.7   Hemoglobin 12.0 - 15.0 g/dL 9.2  89.6  8.4 Repeated and verified X2.   Hematocrit 36.0 - 46.0 % 30.3  33.6  28.0   Platelets 150.0 - 400.0 K/uL 528.0  605.0  896.0        Latest Ref Rng & Units 01/22/2024    2:11 PM 08/05/2023    4:07 PM 06/04/2021   10:04 AM  CMP  Glucose 70 - 99 mg/dL 91  96  90   BUN 6 - 23 mg/dL 13  25  9    Creatinine 0.40 - 1.20 mg/dL 9.34  9.20  9.28   Sodium 135 - 145 mEq/L 139  133  131   Potassium 3.5 - 5.1 mEq/L 4.3  3.9  4.3   Chloride 96 - 112 mEq/L 104  93  100   CO2 19 - 32 mEq/L 27  27  18    Calcium 8.4 - 10.5 mg/dL 9.3  9.3  9.4   Total  Protein 6.0 - 8.3 g/dL  7.8  7.7   Total Bilirubin 0.2 - 1.2 mg/dL  0.2  0.9   Alkaline Phos 39 - 117 U/L  71  90   AST 0 - 37 U/L  11  27   ALT 0 - 35 U/L  12  12      ASSESSMENT & PLAN Krysia Lindon 33 y.o. female with medical history significant for ulcerative colitis with recurrent Crohn's disease, migraines, and iron deficiency anemia who presents for evaluation.  After review of the labs, review of the records, and discussion with the patient the patients findings are most consistent with severe iron deficiency anemia in the setting of inflammatory bowel disease  # Iron Deficiency Anemia 2/2 to Inflammatory Bowel Disease -- Findings are consistent with iron deficiency anemia secondary to inflammatory bowel disease. --Encouraged patient to follow-up with GI for continued treatment of her inflammatory bowel disease. --We will confirm iron deficiency anemia by ordering iron panel and ferritin as well as reticulocytes, CBC, and CMP.  Additionally will order vitamin B12 and folate levels to assure no other nutrients are running low. --Recommend strongly against the use of p.o. iron therapy in the setting of inflammatory bowel disease. --We will plan to proceed with IV iron therapy in order to help bolster the patient's blood counts. --Plan for return to clinic in 4 to 6 weeks time after last dose of IV iron    Orders Placed This Encounter  Procedures   CBC with Differential (Cancer Center Only)    Standing Status:   Future    Number of Occurrences:   1    Expiration Date:   06/18/2025   CMP (Cancer Center only)    Standing Status:   Future    Number of Occurrences:  1    Expiration Date:   06/18/2025   Ferritin    Standing Status:   Future    Number of Occurrences:   1    Expiration Date:   06/18/2025   Iron and Iron Binding Capacity ([facility] only)    Standing Status:   Future    Number of Occurrences:   1    Expiration Date:   06/18/2025   Retic Panel    Standing  Status:   Future    Number of Occurrences:   1    Expiration Date:   06/18/2025   Vitamin B12    Standing Status:   Future    Number of Occurrences:   1    Expiration Date:   06/18/2025   Folate, Serum    Standing Status:   Future    Number of Occurrences:   1    Expiration Date:   06/18/2025   Methylmalonic acid, serum    Standing Status:   Future    Number of Occurrences:   1    Expiration Date:   06/18/2025    All questions were answered. The patient knows to call the clinic with any problems, questions or concerns.  A total of more than 45 minutes were spent on this encounter with face-to-face time and non-face-to-face time, including preparing to see the patient, ordering tests and/or medications, counseling the patient and coordination of care as outlined above.   Norleen IVAR Kidney, MD Department of Hematology/Oncology [facility] Phone: [phone] Pager: [phone] Email: [email or web address]  06/18/2024 10:22 AM

## 2024-06-21 ENCOUNTER — Other Ambulatory Visit (HOSPITAL_COMMUNITY): Payer: Self-pay | Admitting: Hematology and Oncology

## 2024-06-21 ENCOUNTER — Telehealth: Payer: Self-pay

## 2024-06-21 NOTE — Telephone Encounter (Signed)
 Dr. Federico, patient will be scheduled as soon as possible.  Auth Submission: NO AUTH NEEDED Site of care: Site of care: [facility] Payer: [employer] commercial Medication & CPT/J Code(s) submitted: Venofer (Iron Sucrose) J1756 Diagnosis Code:  Route of submission (phone, fax, portal):  Phone # Fax # Auth type: Buy/Bill PB Units/visits requested: 200mg  x 5 doses Reference number:  Approval from: 06/21/24 to 07/28/24

## 2024-06-21 NOTE — Telephone Encounter (Signed)
 Entyvio approved. See 11/19 Infusion center telephone encounter

## 2024-06-24 LAB — METHYLMALONIC ACID, SERUM: Methylmalonic Acid, Quantitative: 133 nmol/L (ref 0–378)

## 2024-06-28 ENCOUNTER — Other Ambulatory Visit (HOSPITAL_COMMUNITY): Payer: Self-pay

## 2024-07-01 ENCOUNTER — Ambulatory Visit

## 2024-07-01 VITALS — BP 98/67 | HR 59 | Temp 98.0°F | Resp 16 | Ht 67.0 in | Wt 135.0 lb

## 2024-07-01 DIAGNOSIS — K91850 Pouchitis: Secondary | ICD-10-CM | POA: Diagnosis not present

## 2024-07-01 DIAGNOSIS — K5000 Crohn's disease of small intestine without complications: Secondary | ICD-10-CM | POA: Diagnosis not present

## 2024-07-01 MED ORDER — SODIUM CHLORIDE 0.9 % IV SOLN: INTRAVENOUS | Status: DC

## 2024-07-01 MED ORDER — VEDOLIZUMAB 300 MG IV SOLR
300.0000 mg | Freq: Once | INTRAVENOUS | Status: AC
  Administered 2024-07-01: 300 mg via INTRAVENOUS
  Filled 2024-07-01: qty 5

## 2024-07-01 NOTE — Progress Notes (Signed)
 Diagnosis: Crohn's Disease  Provider:  Praveen Mannam MD  Procedure: IV Infusion  IV Type: Peripheral, IV Location: R Forearm   Entyvio (Vedolizumab), Dose: 300 mg  Infusion Start Time: 0845  Infusion Stop Time: 0925  Post Infusion IV Care: Observation period completed and Peripheral IV Discontinued  Discharge: Condition: Good, Destination: Home . AVS Declined  Performed by:  Leita FORBES Miles, LPN

## 2024-07-05 ENCOUNTER — Ambulatory Visit (INDEPENDENT_AMBULATORY_CARE_PROVIDER_SITE_OTHER)

## 2024-07-05 VITALS — BP 110/74 | HR 69 | Temp 98.0°F | Resp 14 | Ht 67.0 in | Wt 134.6 lb

## 2024-07-05 DIAGNOSIS — D500 Iron deficiency anemia secondary to blood loss (chronic): Secondary | ICD-10-CM

## 2024-07-05 MED ORDER — IRON SUCROSE 200 MG IVPB - SIMPLE MED
200.0000 mg | Freq: Once | Status: AC
  Administered 2024-07-05: 200 mg via INTRAVENOUS

## 2024-07-05 NOTE — Progress Notes (Signed)
 Diagnosis: Iron  Deficiency Anemia  Provider:  Mannam, Praveen MD  Procedure: IV Infusion  IV Type: Peripheral, IV Location: R Forearm  Venofer  (iron  sucrose), Dose: 200 mg  Infusion Start Time: 0830  Infusion Stop Time: 0846  Post Infusion IV Care: Observation period completed and Peripheral IV Discontinued  Discharge: Condition: Good, Destination: Home . AVS Declined  Performed by:  Rocky FORBES Sar, RN

## 2024-07-12 ENCOUNTER — Ambulatory Visit

## 2024-07-13 ENCOUNTER — Other Ambulatory Visit: Payer: Self-pay

## 2024-07-15 ENCOUNTER — Ambulatory Visit

## 2024-07-15 VITALS — BP 114/69 | HR 61 | Temp 97.9°F | Resp 18 | Ht 67.0 in | Wt 135.8 lb

## 2024-07-15 DIAGNOSIS — K5000 Crohn's disease of small intestine without complications: Secondary | ICD-10-CM

## 2024-07-15 DIAGNOSIS — K91850 Pouchitis: Secondary | ICD-10-CM | POA: Diagnosis not present

## 2024-07-15 MED ORDER — VEDOLIZUMAB 300 MG IV SOLR
300.0000 mg | Freq: Once | INTRAVENOUS | Status: AC
  Administered 2024-07-15: 09:00:00 300 mg via INTRAVENOUS
  Filled 2024-07-15: qty 5

## 2024-07-15 NOTE — Progress Notes (Cosign Needed)
 Diagnosis: Crohn's Disease  Provider:  Praveen Mannam MD  Procedure: IV Infusion  IV Type: Peripheral, IV Location: R Forearm  Entyvio  (Vedolizumab ), Dose: 300 mg  Infusion Start Time: 0856  Infusion Stop Time: 0940  Post Infusion IV Care: Peripheral IV Discontinued  Discharge: Condition: Good, Destination: Home . AVS Declined  Performed by:  Katerin Negrete, RN

## 2024-07-16 ENCOUNTER — Ambulatory Visit

## 2024-07-16 VITALS — BP 109/65 | HR 81 | Temp 98.1°F | Resp 14 | Ht 67.0 in | Wt 134.6 lb

## 2024-07-16 DIAGNOSIS — D500 Iron deficiency anemia secondary to blood loss (chronic): Secondary | ICD-10-CM | POA: Diagnosis not present

## 2024-07-16 MED ORDER — IRON SUCROSE 200 MG IVPB - SIMPLE MED
200.0000 mg | Freq: Once | Status: AC
  Administered 2024-07-16: 200 mg via INTRAVENOUS
  Filled 2024-07-16: qty 110

## 2024-07-16 NOTE — Progress Notes (Signed)
 Diagnosis:  Iron  Deficiency Anemia  Provider:  Praveen Mannam MD  Procedure: IV Infusion  IV Type: Peripheral, IV Location: R Forearm   Venofer  (Iron  Sucrose), Dose: 200 mg  Infusion Start Time: 0923  Infusion Stop Time: 0940  Post Infusion IV Care: Patient declined observation and Peripheral IV Discontinued  Discharge: Condition: Good, Destination: Home . AVS Declined  Performed by:  Maximiano JONELLE Pouch, LPN

## 2024-07-19 ENCOUNTER — Ambulatory Visit

## 2024-07-23 ENCOUNTER — Ambulatory Visit: Admitting: *Deleted

## 2024-07-23 VITALS — BP 134/73 | HR 61 | Temp 98.1°F | Resp 14 | Ht 67.0 in | Wt 133.0 lb

## 2024-07-23 DIAGNOSIS — D500 Iron deficiency anemia secondary to blood loss (chronic): Secondary | ICD-10-CM | POA: Diagnosis not present

## 2024-07-23 MED ORDER — IRON SUCROSE 200 MG IVPB - SIMPLE MED
200.0000 mg | Freq: Once | Status: AC
  Administered 2024-07-23: 200 mg via INTRAVENOUS
  Filled 2024-07-23: qty 110

## 2024-07-23 NOTE — Progress Notes (Signed)
 Diagnosis: Iron  Deficiency Anemia  Provider:  Mannam, Praveen MD  Procedure: IV Infusion  IV Type: Peripheral, IV Location:  Iron  Sucrose    Dose: 200 mg  Infusion Start Time: 1103 am  Infusion Stop Time: 1123 am  Post Infusion IV Care: Observation period completed and Peripheral IV Discontinued  Discharge: Condition: Good, Destination: Home . AVS Declined  Performed by:  Trudy Lamarr LABOR, RN

## 2024-07-26 ENCOUNTER — Ambulatory Visit

## 2024-07-30 ENCOUNTER — Ambulatory Visit (INDEPENDENT_AMBULATORY_CARE_PROVIDER_SITE_OTHER): Admitting: *Deleted

## 2024-07-30 VITALS — BP 105/71 | HR 64 | Temp 97.8°F | Resp 14 | Ht 67.0 in | Wt 132.8 lb

## 2024-07-30 DIAGNOSIS — D500 Iron deficiency anemia secondary to blood loss (chronic): Secondary | ICD-10-CM

## 2024-07-30 MED ORDER — IRON SUCROSE 200 MG IVPB - SIMPLE MED
200.0000 mg | Freq: Once | Status: AC
  Administered 2024-07-30: 200 mg via INTRAVENOUS
  Filled 2024-07-30: qty 110

## 2024-07-30 NOTE — Progress Notes (Signed)
 Diagnosis: Iron  Deficiency Anemia  Provider:  Mannam, Praveen MD  Procedure: IV Infusion  IV Type: Peripheral, IV Location: R Forearm  Venofer  (Iron  Sucrose), Dose: 200 mg  Infusion Start Time: 0947  Infusion Stop Time: 1007  Post Infusion IV Care: Patient declined observation and Peripheral IV Discontinued  Discharge: Condition: Good, Destination: Home . AVS Declined  Performed by:  Clint Biello E, RN

## 2024-08-02 ENCOUNTER — Ambulatory Visit

## 2024-08-06 ENCOUNTER — Ambulatory Visit (INDEPENDENT_AMBULATORY_CARE_PROVIDER_SITE_OTHER)

## 2024-08-06 VITALS — BP 105/71 | HR 71 | Temp 98.7°F | Resp 20 | Ht 67.0 in | Wt 133.4 lb

## 2024-08-06 DIAGNOSIS — D500 Iron deficiency anemia secondary to blood loss (chronic): Secondary | ICD-10-CM

## 2024-08-06 MED ORDER — IRON SUCROSE 200 MG IVPB - SIMPLE MED
200.0000 mg | Freq: Once | Status: AC
  Administered 2024-08-06: 200 mg via INTRAVENOUS
  Filled 2024-08-06: qty 110

## 2024-08-06 NOTE — Progress Notes (Signed)
 Diagnosis: Iron  Deficiency Anemia  Provider:  Mannam, Praveen MD  Procedure: IV Infusion  IV Type: Peripheral, IV Location: R Antecubital  Venofer  (Iron  Sucrose), Dose: 200 mg  Infusion Start Time: 1014  Infusion Stop Time: 1030  Post Infusion IV Care: Patient declined observation and Peripheral IV Discontinued  Discharge: Condition: Stable, Destination: Home . AVS Declined  Performed by:  Rocky FORBES Search, RN

## 2024-08-12 ENCOUNTER — Ambulatory Visit

## 2024-08-12 VITALS — BP 116/71 | HR 64 | Temp 97.6°F | Resp 14 | Ht 67.0 in | Wt 133.2 lb

## 2024-08-12 DIAGNOSIS — K91850 Pouchitis: Secondary | ICD-10-CM | POA: Diagnosis not present

## 2024-08-12 DIAGNOSIS — K5000 Crohn's disease of small intestine without complications: Secondary | ICD-10-CM

## 2024-08-12 MED ORDER — VEDOLIZUMAB 300 MG IV SOLR
300.0000 mg | Freq: Once | INTRAVENOUS | Status: AC
  Administered 2024-08-12: 300 mg via INTRAVENOUS
  Filled 2024-08-12: qty 5

## 2024-08-12 MED ORDER — SODIUM CHLORIDE 0.9 % IV SOLN: INTRAVENOUS | Status: DC

## 2024-08-12 NOTE — Progress Notes (Signed)
 Diagnosis: Crohn's Disease  Provider:  Praveen Mannam MD  Procedure: IV Infusion  IV Type: Peripheral, IV Location: R Forearm   Entyvio  (Vedolizumab ), Dose: 300 mg  Infusion Start Time: 0859  Infusion Stop Time: 0930  Post Infusion IV Care: Peripheral IV Discontinued  Discharge: Condition: Good, Destination: Home . AVS Declined  Performed by:  Leita FORBES Miles, LPN

## 2024-08-13 ENCOUNTER — Inpatient Hospital Stay: Attending: Hematology and Oncology

## 2024-08-13 ENCOUNTER — Other Ambulatory Visit: Payer: Self-pay | Admitting: Hematology and Oncology

## 2024-08-13 DIAGNOSIS — D500 Iron deficiency anemia secondary to blood loss (chronic): Secondary | ICD-10-CM

## 2024-08-13 LAB — CBC WITH DIFFERENTIAL (CANCER CENTER ONLY)
Abs Immature Granulocytes: 0.02 K/uL (ref 0.00–0.07)
Basophils Absolute: 0.1 K/uL (ref 0.0–0.1)
Basophils Relative: 1 %
Eosinophils Absolute: 0.1 K/uL (ref 0.0–0.5)
Eosinophils Relative: 1 %
HCT: 41.4 % (ref 36.0–46.0)
Hemoglobin: 12.6 g/dL (ref 12.0–15.0)
Immature Granulocytes: 0 %
Lymphocytes Relative: 32 %
Lymphs Abs: 2 K/uL (ref 0.7–4.0)
MCH: 22.7 pg — ABNORMAL LOW (ref 26.0–34.0)
MCHC: 30.4 g/dL (ref 30.0–36.0)
MCV: 74.6 fL — ABNORMAL LOW (ref 80.0–100.0)
Monocytes Absolute: 0.4 K/uL (ref 0.1–1.0)
Monocytes Relative: 6 %
Neutro Abs: 3.6 K/uL (ref 1.7–7.7)
Neutrophils Relative %: 60 %
Platelet Count: 426 K/uL — ABNORMAL HIGH (ref 150–400)
RBC: 5.55 MIL/uL — ABNORMAL HIGH (ref 3.87–5.11)
RDW: 27.3 % — ABNORMAL HIGH (ref 11.5–15.5)
WBC Count: 6.1 K/uL (ref 4.0–10.5)
nRBC: 0 % (ref 0.0–0.2)

## 2024-08-13 LAB — IRON AND IRON BINDING CAPACITY (CC-WL,HP ONLY)
Iron: 125 ug/dL (ref 28–170)
Saturation Ratios: 39 % — ABNORMAL HIGH (ref 10.4–31.8)
TIBC: 321 ug/dL (ref 250–450)
UIBC: 196 ug/dL

## 2024-08-13 LAB — RETIC PANEL
Immature Retic Fract: 6 % (ref 2.3–15.9)
RBC.: 5.43 MIL/uL — ABNORMAL HIGH (ref 3.87–5.11)
Retic Count, Absolute: 33.1 K/uL (ref 19.0–186.0)
Retic Ct Pct: 0.6 % (ref 0.4–3.1)
Reticulocyte Hemoglobin: 30 pg

## 2024-08-13 LAB — CMP (CANCER CENTER ONLY)
ALT: 21 U/L (ref 0–44)
AST: 24 U/L (ref 15–41)
Albumin: 4.6 g/dL (ref 3.5–5.0)
Alkaline Phosphatase: 52 U/L (ref 38–126)
Anion gap: 12 (ref 5–15)
BUN: 9 mg/dL (ref 6–20)
CO2: 23 mmol/L (ref 22–32)
Calcium: 9.8 mg/dL (ref 8.9–10.3)
Chloride: 104 mmol/L (ref 98–111)
Creatinine: 0.8 mg/dL (ref 0.44–1.00)
GFR, Estimated: >60 mL/min
Glucose, Bld: 76 mg/dL (ref 70–99)
Potassium: 4.2 mmol/L (ref 3.5–5.1)
Sodium: 139 mmol/L (ref 135–145)
Total Bilirubin: 0.5 mg/dL (ref 0.0–1.2)
Total Protein: 8.3 g/dL — ABNORMAL HIGH (ref 6.5–8.1)

## 2024-08-13 LAB — FERRITIN: Ferritin: 167 ng/mL (ref 11–307)

## 2024-08-19 NOTE — Progress Notes (Unsigned)
 " 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 Iron  sat 0.8%, Ferritin 1.9  06/18/2024: establish care with Dr. Federico  12/19-08/06/2024: IV venofer  200 mg x 5 doses.   Interval History:  Brooke Mckee 34 y.o. female with medical history significant for IDA 2/2 inflammatory bowel disease who presents for a follow up visit. The patient's last visit was on 06/18/2024. In the interim since the last visit she received 5 doses of IV Venofer  and tolerated it well.  On exam today Brooke Mckee reports that she was a challenging stick and it took several sticks to get her IV started.  Once the IV started she tolerated it well but did have some issues with feeling tired afterwards.  She had flulike symptoms after her infusions.  She reports it lasted for about a day.  She has noticed some improvement such as decreasing brain fog but still remains very dizzy.  She reports her energy today is about a 7 or 8 out of 10.  She is not having any shortness of breath or chest pain and notes that she has been able to do her upper singing without difficulty.  She reports no overt signs of bleeding such as gum bleeding, blood in the urine, or blood in the stool.  She reports that she has had no other recent infectious symptoms such as fevers, chills, sweats, nausea, or diarrhea.  Her labs did reveal a persistent microcytosis as well as a slight erythrocytosis.  Findings could be consistent with inflammation from her IBD though could represent an underlying thalassemia.  We discussed this possibility today.  She was agreeable to having thalassemia testing.  MEDICAL HISTORY:  Past Medical History:  Diagnosis Date   Allergy    Crohn  disease (HCC)    Migraines    Pouchitis (HCC)    Ulcerative colitis (HCC)     SURGICAL HISTORY: Past Surgical History:  Procedure Laterality Date   BIOPSY  06/05/2021   Procedure: BIOPSY;  Surgeon: San Sandor GAILS, DO;  Location: [facility];  Service: Gastroenterology;;   CESAREAN SECTION     CESAREAN SECTION N/A 07/11/2021   Procedure: CESAREAN SECTION;  Surgeon: Eveline Lynwood MATSU, MD;  Location: [facility];  Service: Obstetrics;  Laterality: N/A;   EXCISION MASS ABDOMINAL N/A 07/09/2023   Procedure: excision of abdominal adhesions;  Surgeon: Lowery Estefana RAMAN, DO;  Location:  SURGERY CENTER;  Service: Plastics;  Laterality: N/A;   FLEXIBLE SIGMOIDOSCOPY N/A 06/05/2021   Procedure: FLEXIBLE SIGMOIDOSCOPY;  Surgeon: San Sandor GAILS, DO;  Location: [facility];  Service: Gastroenterology;  Laterality: N/A;  unsedated procedure   ILEAL POUCH  2012   [place]   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  Other Topics Concern   Not on file  Social History Narrative   Not on file   Social Drivers of Health   Tobacco Use: Low Risk (06/09/2024)   Patient History    Smoking Tobacco Use: Never    Smokeless Tobacco Use: Never    Passive Exposure: Not on file  Financial Resource Strain: Not on file  Food Insecurity: No Food Insecurity (06/14/2024)   Epic    Worried About Programme Researcher, Broadcasting/film/video in the Last Year: Never true    Ran Out of Food in the Last Year:  Never true  Transportation Needs: No Transportation Needs (06/14/2024)   Epic    Lack of Transportation (Medical): No    Lack of Transportation (Non-Medical): No  Physical Activity: Not on file  Stress: Not on file  Social Connections: Not on file  Intimate Partner Violence: Not on file  Depression (PHQ2-9): Low Risk (06/18/2024)   Depression (PHQ2-9)    PHQ-2 Score: 0  Alcohol Screen: Not on file  Housing: Low Risk (06/14/2024)   Epic    Unable to Pay for Housing in the Last Year: No    Number of Times Moved in the Last Year: 0    Homeless in the Last Year: No  Utilities: Not At Risk (06/14/2024)   Epic    Threatened with loss of utilities: No  Health Literacy: Not on file    FAMILY HISTORY: Family History  Problem Relation Age of Onset   Colitis Father    Colon cancer Paternal Uncle    Rectal cancer Neg Hx    Esophageal cancer Neg Hx    Stomach cancer Neg Hx     ALLERGIES:  is allergic to morphine.  MEDICATIONS:  Current Outpatient Medications  Medication Sig Dispense Refill   Prenatal Vit-Fe Fumarate-FA (PRENATAL VITAMINS) 28-0.8 MG TABS Take 1 tablet by mouth daily.     No current facility-administered medications for this visit.    REVIEW OF SYSTEMS:   Constitutional: ( - ) fevers, ( - )  chills , ( - ) night sweats Eyes: ( - ) blurriness of vision, ( - ) double vision, ( - ) watery eyes Ears, nose, mouth, throat, and face: ( - ) mucositis, ( - ) sore throat Respiratory: ( - ) cough, ( - ) dyspnea, ( - ) wheezes Cardiovascular: ( - ) palpitation, ( - ) chest discomfort, ( - ) lower extremity swelling Gastrointestinal:  ( - ) nausea, ( - ) heartburn, ( - ) change in bowel habits Skin: ( - ) abnormal skin rashes Lymphatics: ( - ) new lymphadenopathy, ( - ) easy bruising Neurological: ( - ) numbness, ( - ) tingling, ( - ) new weaknesses Behavioral/Psych: ( - ) mood change, ( - ) new changes  All other systems were reviewed with the patient and are  negative.  PHYSICAL EXAMINATION: Vitals:   08/20/24 0857  BP: (!) 117/57  Pulse: 66  Resp: 17  Temp: (!) 97.4 F (36.3 C)  SpO2: 100%   Filed Weights   08/20/24 0857  Weight: 133 lb (60.3 kg)    GENERAL: Well-appearing young female, alert, no distress and comfortable SKIN: skin color, texture, turgor are normal, no rashes or significant lesions EYES: conjunctiva are pink and non-injected, sclera clear LUNGS: clear to auscultation and percussion with normal breathing effort HEART: regular rate & rhythm  and no murmurs and no lower extremity edema Musculoskeletal: no cyanosis of digits and no clubbing  PSYCH: alert & oriented x 3, fluent speech NEURO: no focal motor/sensory deficits  LABORATORY DATA:  I have reviewed the data as listed    Latest Ref Rng & Units 08/13/2024    8:32 AM 06/18/2024    9:54 AM 05/25/2024    9:28 AM  CBC  WBC 4.0 - 10.5 K/uL 6.1  5.8  4.1   Hemoglobin 12.0 - 15.0 g/dL 87.3  9.7  9.2   Hematocrit 36.0 - 46.0 % 41.4  32.3  30.3   Platelets 150 - 400 K/uL 426  542  528.0        Latest Ref Rng & Units 08/13/2024    8:32 AM 06/18/2024    9:54 AM 01/22/2024    2:11 PM  CMP  Glucose 70 - 99 mg/dL 76  75  91   BUN 6 - 20 mg/dL 9  11  13    Creatinine 0.44 - 1.00 mg/dL 9.19  9.29  9.34   Sodium 135 - 145 mmol/L 139  138  139   Potassium 3.5 - 5.1 mmol/L 4.2  4.1  4.3   Chloride 98 - 111 mmol/L 104  105  104   CO2 22 - 32 mmol/L 23  24  27    Calcium 8.9 - 10.3 mg/dL 9.8  9.9  9.3   Total Protein 6.5 - 8.1 g/dL 8.3  8.1    Total Bilirubin 0.0 - 1.2 mg/dL 0.5  0.3    Alkaline Phos 38 - 126 U/L 52  49    AST 15 - 41 U/L 24  20    ALT 0 - 44 U/L 21  12     RADIOGRAPHIC STUDIES: No results found.  ASSESSMENT & PLAN Cilicia Fitchett 34 y.o. female with medical history significant for IDA 2/2 Inflammatory bowel disease who presents for a follow up visit.   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 --Encouraged patient to follow-up with GI for continued treatment of her inflammatory bowel disease. --labs today show white blood cell 6.1, hemoglobin 12.6, MCV 74.6, platelets 426.  Red blood cells 5.55 --Recommend strongly against the use of p.o. iron  therapy in the setting of inflammatory bowel disease. --We will plan to proceed with IV iron  therapy in order to help bolster the patient's blood counts if her iron  levels drop again. --RTC in 3 months for labs in 6 months for clinic visit  # Persistent Microcytosis # Erythrocytosis -- May represent either thalassemia versus microcytosis secondary to inflammation from inflammatory bowel disease. -- Will order thalassemia testing with her next of the labs.  Orders Placed This Encounter  Procedures   CBC with Differential (Cancer Center Only)    Standing Status:   Future    Expiration Date:   08/20/2025   CMP (Cancer Center only)    Standing Status:   Future    Expiration Date:   08/20/2025   Ferritin    Standing Status:   Future    Expiration Date:   08/20/2025   Iron  and Iron  Binding Capacity ([facility] only)    Standing Status:   Future  Expiration Date:   08/20/2025   Retic Panel    Standing Status:   Future    Expiration Date:   08/20/2025   Hgb Fractionation Cascade    Standing Status:   Future    Expiration Date:   08/20/2025   Alpha-Thalassemia Analysis    Standing Status:   Future    Expected Date:   11/18/2024    Expiration Date:   08/20/2025    Indication:   microcytosis/erythrocytosis    All questions were answered. The patient knows to call the clinic with any problems, questions or concerns.  A total of more than 30 minutes were spent on this encounter  with face-to-face time and non-face-to-face time, including preparing to see the patient, ordering tests and/or medications, counseling the patient and coordination of care as outlined above.   Norleen IVAR Kidney, MD Department of Hematology/Oncology [facility] at [facility] Phone: [phone] Pager: [phone] Email: [email or web address]  08/20/2024 10:15 AM  "

## 2024-08-20 ENCOUNTER — Inpatient Hospital Stay: Admitting: Hematology and Oncology

## 2024-08-20 VITALS — BP 117/57 | HR 66 | Temp 97.4°F | Resp 17 | Ht 67.0 in | Wt 133.0 lb

## 2024-08-20 DIAGNOSIS — D500 Iron deficiency anemia secondary to blood loss (chronic): Secondary | ICD-10-CM | POA: Diagnosis not present

## 2024-10-07 ENCOUNTER — Ambulatory Visit

## 2024-11-19 ENCOUNTER — Inpatient Hospital Stay: Attending: Hematology and Oncology

## 2025-02-11 ENCOUNTER — Inpatient Hospital Stay: Attending: Hematology and Oncology

## 2025-02-18 ENCOUNTER — Inpatient Hospital Stay: Admitting: Hematology and Oncology
